# Patient Record
Sex: Female | Born: 1980
Health system: Southern US, Community
[De-identification: ages and names within clinical notes are randomized; demographics above are authoritative.]

## PROBLEM LIST (undated history)

## (undated) DIAGNOSIS — L309 Dermatitis, unspecified: Secondary | ICD-10-CM

## (undated) DIAGNOSIS — B009 Herpesviral infection, unspecified: Secondary | ICD-10-CM

## (undated) DIAGNOSIS — F419 Anxiety disorder, unspecified: Secondary | ICD-10-CM

## (undated) DIAGNOSIS — Z87442 Personal history of urinary calculi: Secondary | ICD-10-CM

---
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
## 2008-03-21 ENCOUNTER — Encounter: Admission: RE | Admit: 2008-03-21 | Discharge: 2008-03-21 | Payer: Self-pay | Admitting: Physician Assistant

## 2011-11-04 ENCOUNTER — Encounter: Payer: Self-pay | Admitting: Internal Medicine

## 2011-11-08 ENCOUNTER — Telehealth: Payer: Self-pay | Admitting: Gastroenterology

## 2011-11-08 NOTE — Telephone Encounter (Signed)
Left message for patient to call back  

## 2011-11-12 NOTE — Telephone Encounter (Signed)
I have been unable to reach pt messages have been left.  Pt has not returned any phone calls.  I will await further communication from pt

## 2011-11-14 ENCOUNTER — Encounter: Payer: Self-pay | Admitting: Internal Medicine

## 2011-11-22 ENCOUNTER — Ambulatory Visit: Payer: Self-pay | Admitting: Internal Medicine

## 2011-12-05 ENCOUNTER — Encounter: Payer: Self-pay | Admitting: Internal Medicine

## 2011-12-09 ENCOUNTER — Ambulatory Visit: Payer: Self-pay | Admitting: Internal Medicine

## 2014-06-15 ENCOUNTER — Ambulatory Visit: Payer: Self-pay | Admitting: Medical

## 2014-06-28 ENCOUNTER — Telehealth: Payer: Self-pay | Admitting: *Deleted

## 2014-06-28 NOTE — Telephone Encounter (Signed)
Unable to reach patient at time of Pre-Visit Call.  Left message for patient to return call when available.    

## 2014-06-29 ENCOUNTER — Encounter: Payer: Self-pay | Admitting: *Deleted

## 2014-06-29 ENCOUNTER — Ambulatory Visit (INDEPENDENT_AMBULATORY_CARE_PROVIDER_SITE_OTHER): Payer: 59 | Admitting: Medical

## 2014-06-29 ENCOUNTER — Encounter: Payer: Self-pay | Admitting: Medical

## 2014-06-29 VITALS — BP 117/78 | HR 96 | Temp 98.1°F | Ht 65.0 in | Wt 184.8 lb

## 2014-06-29 DIAGNOSIS — Z0000 Encounter for general adult medical examination without abnormal findings: Secondary | ICD-10-CM

## 2014-06-29 DIAGNOSIS — N200 Calculus of kidney: Secondary | ICD-10-CM

## 2014-06-29 DIAGNOSIS — Z8709 Personal history of other diseases of the respiratory system: Secondary | ICD-10-CM | POA: Insufficient documentation

## 2014-06-29 DIAGNOSIS — S46819A Strain of other muscles, fascia and tendons at shoulder and upper arm level, unspecified arm, initial encounter: Secondary | ICD-10-CM | POA: Insufficient documentation

## 2014-06-29 LAB — POCT URINALYSIS DIPSTICK
BILIRUBIN UA: NEGATIVE
Blood, UA: NEGATIVE
GLUCOSE UA: NEGATIVE
Ketones, UA: NEGATIVE
Leukocytes, UA: NEGATIVE
NITRITE UA: NEGATIVE
Protein, UA: NEGATIVE
UROBILINOGEN UA: 0.2
pH, UA: 7.5

## 2014-06-29 NOTE — Assessment & Plan Note (Signed)
When younger as child 20 yrs since any symptoms.

## 2014-06-29 NOTE — Progress Notes (Signed)
Pre visit review using our clinic review tool, if applicable. No additional management support is needed unless otherwise documented below in the visit note. 

## 2014-06-29 NOTE — Progress Notes (Signed)
Subjective:    Patient ID: Valerie Small, female    DOB: March 27, 1980, 34 y.o.   MRN: [id number]  HPI  I have reviewed pt PMH, PSH, FH, Social History and Surgical History  Asthma- When she younger. Exercise induced. No symptoms for 20 yrs or more.  Kidney stone- about 6 years ago. None since.  Pt works [employer], Exercises started 3 wks ago, weights and cardio, coffee 2 cups a day, diet improving recently, married-1 child.  Pt last pap 1 year ago. LMP- pt has mirena.  Pt has not had physical in some years and would like to have one.   On review only. Pt states occasional rt sided trapezius discomfort and points to her medial scapula area. Discomfort with faint tingling sensation(last 3-4 minutes) She does some range of motion  rt upper extremity  and seems to help. Noticed more at work. She notes more when at work typing or painting at home. Pt is rt handed. Symptom for 3 months on and off and not constant.     Review of Systems  Constitutional: Negative for fever, chills, diaphoresis, activity change and fatigue.  HENT: Negative.   Respiratory: Negative for cough, chest tightness and shortness of breath.   Cardiovascular: Negative for chest pain, palpitations and leg swelling.  Gastrointestinal: Negative for nausea, vomiting and abdominal pain.  Musculoskeletal: Negative for neck pain and neck stiffness.       Rt trapezius to medial scapula region faint discomfort/tingling at times.  Neurological: Negative for dizziness, tremors, seizures, syncope, facial asymmetry, speech difficulty, weakness, light-headedness, numbness and headaches.  Psychiatric/Behavioral: Negative for behavioral problems, confusion and agitation. The patient is not nervous/anxious.      Past Medical History  Diagnosis Date  . Asthma   . Chronic kidney disease     Some kidney stone when son was born.    History   Social History  . Marital Status: Married    Spouse Name: N/A    . Number of Children: N/A  . Years of Education: N/A   Occupational History  . Not on file.   Social History Main Topics  . Smoking status: Never Smoker   . Smokeless tobacco: Never Used  . Alcohol Use: 0.0 oz/week    0 Standard drinks or equivalent per week     Comment: 4 beers on weekend.  . Drug Use: No  . Sexual Activity: Not on file   Other Topics Concern  . Not on file   Social History Narrative    Past Surgical History  Procedure Laterality Date  . Wisdom tooth extraction      Family History  Problem Relation Age of Onset  . Hypertension Mother   . Hypertension Father   . Diabetes Maternal Aunt     No Known Allergies  No current outpatient prescriptions on file prior to visit.   No current facility-administered medications on file prior to visit.    BP 117/78 mmHg  Pulse 96  Temp(Src) 98.1 F (36.7 C) (Other (Comment))  Ht 5\' 5"  (1.651 m)  Wt 184 lb 12.8 oz (83.825 kg)  BMI 30.75 kg/m2  SpO2 96%      Objective:   Physical Exam  General Mental Status- Alert. General Appearance- Not in acute distress.   Skin General: Color- Normal Color. Moisture- Normal Moisture.  Neck Carotid Arteries- Normal color. Moisture- Normal Moisture. No carotid bruits. No JVD. No rt side trapezius pain presently.  Chest and Lung Exam Auscultation: Breath  Sounds:-Normal.  Cardiovascular Auscultation:Rythm- Regular. Murmurs & Other Heart Sounds:Auscultation of the heart reveals- No Murmurs.  Abdomen Inspection:-Inspeection Normal. Palpation/Percussion:Note:No mass. Palpation and Percussion of the abdomen reveal- Non Tender, Non Distended + BS, no rebound or guarding.  Back- no pain presently. Particularly over rt  medial scapula border Rt upper ext movments- no pain rt trap or scapula region.     Neurologic Cranial Nerve exam:- CN III-XII intact(No nystagmus), symmetric smile. Drift Test:- No drift. Romberg Exam:- Negative.  Heal to Toe Gait  exam:-Normal. Finger to Nose:- Normal/Intact Strength:- 5/5 equal and symmetric strength both upper and lower extremities.  Breast, gyn exam done by gyn(1 yr ago)She sees them regularly.      Assessment & Plan:

## 2014-06-29 NOTE — Telephone Encounter (Signed)
Pre-Visit Call completed with patient and chart updated.   Pre-Visit Info documented in Specialty Comments under [employer].    

## 2014-06-29 NOTE — Assessment & Plan Note (Addendum)
Symptoms mild and intermittent. Did not treat today. Only conservative measures discusses. Try alleve otc.   Noted for historical purposes today. Coded exam as wellness since this was only mentioned and not necessarily treatable condition on today visit.

## 2014-06-29 NOTE — Assessment & Plan Note (Signed)
One time and 6 yrs ago.

## 2014-06-29 NOTE — Assessment & Plan Note (Signed)
Will put in future order cbc, cmp, tsh, and lipid panel to be done fasting within a week.  Ua done today. Pt up to date on pap. Done 1 yr ago. tdap unclear if up today may have had 6 yrs ago. She will check with her gyn office. If not done in past 10 yrs then will update. She will notify us.

## 2014-06-29 NOTE — Addendum Note (Signed)
Addended by: Noreene LarssonLARSON, Amaira Safley A on: 06/29/2014 01:20 PM   Modules accepted: Medications

## 2014-06-29 NOTE — Patient Instructions (Addendum)
Wellness examination Will put in future order cbc, cmp, tsh, and lipid panel to be done fasting within a week.  Ua done today. Pt up to date on pap. Done 1 yr ago. tdap unclear if up today may have had 6 yrs ago. She will check with her gyn office. If not done in past 10 yrs then will update. She will notify us.    Preventive Care for Adults A healthy lifestyle and preventive care can promote health and wellness. Preventive health guidelines for women include the following key practices.  A routine yearly physical is a good way to check with your health care provider about your health and preventive screening. It is a chance to share any concerns and updates on your health and to receive a thorough exam.  Visit your dentist for a routine exam and preventive care every 6 months. Brush your teeth twice a day and floss once a day. Good oral hygiene prevents tooth decay and gum disease.  The frequency of eye exams is based on your age, health, family medical history, use of contact lenses, and other factors. Follow your health care provider's recommendations for frequency of eye exams.  Eat a healthy diet. Foods like vegetables, fruits, whole grains, low-fat dairy products, and lean protein foods contain the nutrients you need without too many calories. Decrease your intake of foods high in solid fats, added sugars, and salt. Eat the right amount of calories for you.Get information about a proper diet from your health care provider, if necessary.  Regular physical exercise is one of the most important things you can do for your health. Most adults should get at least 150 minutes of moderate-intensity exercise (any activity that increases your heart rate and causes you to sweat) each week. In addition, most adults need muscle-strengthening exercises on 2 or more days a week.  Maintain a healthy weight. The body mass index (BMI) is a screening tool to identify possible weight problems. It provides an  estimate of body fat based on height and weight. Your health care provider can find your BMI and can help you achieve or maintain a healthy weight.For adults 20 years and older:  A BMI below 18.5 is considered underweight.  A BMI of 18.5 to 24.9 is normal.  A BMI of 25 to 29.9 is considered overweight.  A BMI of 30 and above is considered obese.  Maintain normal blood lipids and cholesterol levels by exercising and minimizing your intake of saturated fat. Eat a balanced diet with plenty of fruit and vegetables. Blood tests for lipids and cholesterol should begin at age 52 and be repeated every 5 years. If your lipid or cholesterol levels are high, you are over 50, or you are at high risk for heart disease, you may need your cholesterol levels checked more frequently.Ongoing high lipid and cholesterol levels should be treated with medicines if diet and exercise are not working.  If you smoke, find out from your health care provider how to quit. If you do not use tobacco, do not start.  Lung cancer screening is recommended for adults aged 69-80 years who are at high risk for developing lung cancer because of a history of smoking. A yearly low-dose CT scan of the lungs is recommended for people who have at least a 30-pack-year history of smoking and are a current smoker or have quit within the past 15 years. A pack year of smoking is smoking an average of 1 pack of cigarettes a day  for 1 year (for example: 1 pack a day for 30 years or 2 packs a day for 15 years). Yearly screening should continue until the smoker has stopped smoking for at least 15 years. Yearly screening should be stopped for people who develop a health problem that would prevent them from having lung cancer treatment.  If you are pregnant, do not drink alcohol. If you are breastfeeding, be very cautious about drinking alcohol. If you are not pregnant and choose to drink alcohol, do not have more than 1 drink per day. One drink is  considered to be 12 ounces (355 mL) of beer, 5 ounces (148 mL) of wine, or 1.5 ounces (44 mL) of liquor.  Avoid use of street drugs. Do not share needles with anyone. Ask for help if you need support or instructions about stopping the use of drugs.  High blood pressure causes heart disease and increases the risk of stroke. Your blood pressure should be checked at least every 1 to 2 years. Ongoing high blood pressure should be treated with medicines if weight loss and exercise do not work.  If you are 26-58 years old, ask your health care provider if you should take aspirin to prevent strokes.  Diabetes screening involves taking a blood sample to check your fasting blood sugar level. This should be done once every 3 years, after age 58, if you are within normal weight and without risk factors for diabetes. Testing should be considered at a younger age or be carried out more frequently if you are overweight and have at least 1 risk factor for diabetes.  Breast cancer screening is essential preventive care for women. You should practice "breast self-awareness." This means understanding the normal appearance and feel of your breasts and may include breast self-examination. Any changes detected, no matter how small, should be reported to a health care provider. Women in their 59s and 30s should have a clinical breast exam (CBE) by a health care provider as part of a regular health exam every 1 to 3 years. After age 31, women should have a CBE every year. Starting at age 53, women should consider having a mammogram (breast X-ray test) every year. Women who have a family history of breast cancer should talk to their health care provider about genetic screening. Women at a high risk of breast cancer should talk to their health care providers about having an MRI and a mammogram every year.  Breast cancer gene (BRCA)-related cancer risk assessment is recommended for women who have family members with BRCA-related  cancers. BRCA-related cancers include breast, ovarian, tubal, and peritoneal cancers. Having family members with these cancers may be associated with an increased risk for harmful changes (mutations) in the breast cancer genes BRCA1 and BRCA2. Results of the assessment will determine the need for genetic counseling and BRCA1 and BRCA2 testing.  Routine pelvic exams to screen for cancer are no longer recommended for nonpregnant women who are considered low risk for cancer of the pelvic organs (ovaries, uterus, and vagina) and who do not have symptoms. Ask your health care provider if a screening pelvic exam is right for you.  If you have had past treatment for cervical cancer or a condition that could lead to cancer, you need Pap tests and screening for cancer for at least 20 years after your treatment. If Pap tests have been discontinued, your risk factors (such as having a new sexual partner) need to be reassessed to determine if screening should be resumed.  Some women have medical problems that increase the chance of getting cervical cancer. In these cases, your health care provider may recommend more frequent screening and Pap tests.  The HPV test is an additional test that may be used for cervical cancer screening. The HPV test looks for the virus that can cause the cell changes on the cervix. The cells collected during the Pap test can be tested for HPV. The HPV test could be used to screen women aged 32 years and older, and should be used in women of any age who have unclear Pap test results. After the age of 17, women should have HPV testing at the same frequency as a Pap test.  Colorectal cancer can be detected and often prevented. Most routine colorectal cancer screening begins at the age of 89 years and continues through age 25 years. However, your health care provider may recommend screening at an earlier age if you have risk factors for colon cancer. On a yearly basis, your health care provider  may provide home test kits to check for hidden blood in the stool. Use of a small camera at the end of a tube, to directly examine the colon (sigmoidoscopy or colonoscopy), can detect the earliest forms of colorectal cancer. Talk to your health care provider about this at age 27, when routine screening begins. Direct exam of the colon should be repeated every 5-10 years through age 60 years, unless early forms of pre-cancerous polyps or small growths are found.  People who are at an increased risk for hepatitis B should be screened for this virus. You are considered at high risk for hepatitis B if:  You were born in a country where hepatitis B occurs often. Talk with your health care provider about which countries are considered high risk.  Your parents were born in a high-risk country and you have not received a shot to protect against hepatitis B (hepatitis B vaccine).  You have HIV or AIDS.  You use needles to inject street drugs.  You live with, or have sex with, someone who has hepatitis B.  You get hemodialysis treatment.  You take certain medicines for conditions like cancer, organ transplantation, and autoimmune conditions.  Hepatitis C blood testing is recommended for all people born from [birth year] through 1965 and any individual with known risks for hepatitis C.  Practice safe sex. Use condoms and avoid high-risk sexual practices to reduce the spread of sexually transmitted infections (STIs). STIs include gonorrhea, chlamydia, syphilis, trichomonas, herpes, HPV, and human immunodeficiency virus (HIV). Herpes, HIV, and HPV are viral illnesses that have no cure. They can result in disability, cancer, and death.  You should be screened for sexually transmitted illnesses (STIs) including gonorrhea and chlamydia if:  You are sexually active and are younger than 24 years.  You are older than 24 years and your health care provider tells you that you are at risk for this type of  infection.  Your sexual activity has changed since you were last screened and you are at an increased risk for chlamydia or gonorrhea. Ask your health care provider if you are at risk.  If you are at risk of being infected with HIV, it is recommended that you take a prescription medicine daily to prevent HIV infection. This is called preexposure prophylaxis (PrEP). You are considered at risk if:  You are a heterosexual woman, are sexually active, and are at increased risk for HIV infection.  You take drugs by injection.  You are  sexually active with a partner who has HIV.  Talk with your health care provider about whether you are at high risk of being infected with HIV. If you choose to begin PrEP, you should first be tested for HIV. You should then be tested every 3 months for as long as you are taking PrEP.  Osteoporosis is a disease in which the bones lose minerals and strength with aging. This can result in serious bone fractures or breaks. The risk of osteoporosis can be identified using a bone density scan. Women ages 43 years and over and women at risk for fractures or osteoporosis should discuss screening with their health care providers. Ask your health care provider whether you should take a calcium supplement or vitamin D to reduce the rate of osteoporosis.  Menopause can be associated with physical symptoms and risks. Hormone replacement therapy is available to decrease symptoms and risks. You should talk to your health care provider about whether hormone replacement therapy is right for you.  Use sunscreen. Apply sunscreen liberally and repeatedly throughout the day. You should seek shade when your shadow is shorter than you. Protect yourself by wearing long sleeves, pants, a wide-brimmed hat, and sunglasses year round, whenever you are outdoors.  Once a month, do a whole body skin exam, using a mirror to look at the skin on your back. Tell your health care provider of new moles,  moles that have irregular borders, moles that are larger than a pencil eraser, or moles that have changed in shape or color.  Stay current with required vaccines (immunizations).  Influenza vaccine. All adults should be immunized every year.  Tetanus, diphtheria, and acellular pertussis (Td, Tdap) vaccine. Pregnant women should receive 1 dose of Tdap vaccine during each pregnancy. The dose should be obtained regardless of the length of time since the last dose. Immunization is preferred during the 27th-36th week of gestation. An adult who has not previously received Tdap or who does not know her vaccine status should receive 1 dose of Tdap. This initial dose should be followed by tetanus and diphtheria toxoids (Td) booster doses every 10 years. Adults with an unknown or incomplete history of completing a 3-dose immunization series with Td-containing vaccines should begin or complete a primary immunization series including a Tdap dose. Adults should receive a Td booster every 10 years.  Varicella vaccine. An adult without evidence of immunity to varicella should receive 2 doses or a second dose if she has previously received 1 dose. Pregnant females who do not have evidence of immunity should receive the first dose after pregnancy. This first dose should be obtained before leaving the health care facility. The second dose should be obtained 4-8 weeks after the first dose.  Human papillomavirus (HPV) vaccine. Females aged 13-26 years who have not received the vaccine previously should obtain the 3-dose series. The vaccine is not recommended for use in pregnant females. However, pregnancy testing is not needed before receiving a dose. If a female is found to be pregnant after receiving a dose, no treatment is needed. In that case, the remaining doses should be delayed until after the pregnancy. Immunization is recommended for any person with an immunocompromised condition through the age of 69 years if she  did not get any or all doses earlier. During the 3-dose series, the second dose should be obtained 4-8 weeks after the first dose. The third dose should be obtained 24 weeks after the first dose and 16 weeks after the second dose.  Zoster vaccine. One dose is recommended for adults aged 89 years or older unless certain conditions are present.  Measles, mumps, and rubella (MMR) vaccine. Adults born before [birth year] generally are considered immune to measles and mumps. Adults born in 1 or later should have 1 or more doses of MMR vaccine unless there is a contraindication to the vaccine or there is laboratory evidence of immunity to each of the three diseases. A routine second dose of MMR vaccine should be obtained at least 28 days after the first dose for students attending postsecondary schools, health care workers, or international travelers. People who received inactivated measles vaccine or an unknown type of measles vaccine during 1963-1967 should receive 2 doses of MMR vaccine. People who received inactivated mumps vaccine or an unknown type of mumps vaccine before 1979 and are at high risk for mumps infection should consider immunization with 2 doses of MMR vaccine. For females of childbearing age, rubella immunity should be determined. If there is no evidence of immunity, females who are not pregnant should be vaccinated. If there is no evidence of immunity, females who are pregnant should delay immunization until after pregnancy. Unvaccinated health care workers born before [birth year] who lack laboratory evidence of measles, mumps, or rubella immunity or laboratory confirmation of disease should consider measles and mumps immunization with 2 doses of MMR vaccine or rubella immunization with 1 dose of MMR vaccine.  Pneumococcal 13-valent conjugate (PCV13) vaccine. When indicated, a person who is uncertain of her immunization history and has no record of immunization should receive the PCV13 vaccine. An adult  aged 15 years or older who has certain medical conditions and has not been previously immunized should receive 1 dose of PCV13 vaccine. This PCV13 should be followed with a dose of pneumococcal polysaccharide (PPSV23) vaccine. The PPSV23 vaccine dose should be obtained at least 8 weeks after the dose of PCV13 vaccine. An adult aged 65 years or older who has certain medical conditions and previously received 1 or more doses of PPSV23 vaccine should receive 1 dose of PCV13. The PCV13 vaccine dose should be obtained 1 or more years after the last PPSV23 vaccine dose.  Pneumococcal polysaccharide (PPSV23) vaccine. When PCV13 is also indicated, PCV13 should be obtained first. All adults aged 38 years and older should be immunized. An adult younger than age 25 years who has certain medical conditions should be immunized. Any person who resides in a nursing home or long-term care facility should be immunized. An adult smoker should be immunized. People with an immunocompromised condition and certain other conditions should receive both PCV13 and PPSV23 vaccines. People with human immunodeficiency virus (HIV) infection should be immunized as soon as possible after diagnosis. Immunization during chemotherapy or radiation therapy should be avoided. Routine use of PPSV23 vaccine is not recommended for American Indians, [place] Natives, or people younger than 65 years unless there are medical conditions that require PPSV23 vaccine. When indicated, people who have unknown immunization and have no record of immunization should receive PPSV23 vaccine. One-time revaccination 5 years after the first dose of PPSV23 is recommended for people aged 19-64 years who have chronic kidney failure, nephrotic syndrome, asplenia, or immunocompromised conditions. People who received 1-2 doses of PPSV23 before age 35 years should receive another dose of PPSV23 vaccine at age 67 years or later if at least 5 years have passed since the previous  dose. Doses of PPSV23 are not needed for people immunized with PPSV23 at or after age 68 years.  Meningococcal  vaccine. Adults with asplenia or persistent complement component deficiencies should receive 2 doses of quadrivalent meningococcal conjugate (MenACWY-D) vaccine. The doses should be obtained at least 2 months apart. Microbiologists working with certain meningococcal bacteria, [employer] recruits, people at risk during an outbreak, and people who travel to or live in countries with a high rate of meningitis should be immunized. A first-year college student up through age 69 years who is living in a residence hall should receive a dose if she did not receive a dose on or after her 16th birthday. Adults who have certain high-risk conditions should receive one or more doses of vaccine.  Hepatitis A vaccine. Adults who wish to be protected from this disease, have certain high-risk conditions, work with hepatitis A-infected animals, work in hepatitis A research labs, or travel to or work in countries with a high rate of hepatitis A should be immunized. Adults who were previously unvaccinated and who anticipate close contact with an international adoptee during the first 60 days after arrival in the [place] from a country with a high rate of hepatitis A should be immunized.  Hepatitis B vaccine. Adults who wish to be protected from this disease, have certain high-risk conditions, may be exposed to blood or other infectious body fluids, are household contacts or sex partners of hepatitis B positive people, are clients or workers in certain care facilities, or travel to or work in countries with a high rate of hepatitis B should be immunized.  Haemophilus influenzae type b (Hib) vaccine. A previously unvaccinated person with asplenia or sickle cell disease or having a scheduled splenectomy should receive 1 dose of Hib vaccine. Regardless of previous immunization, a recipient of a hematopoietic stem cell  transplant should receive a 3-dose series 6-12 months after her successful transplant. Hib vaccine is not recommended for adults with HIV infection. Preventive Services / Frequency Ages 17 to 85 years  Blood pressure check.** / Every 1 to 2 years.  Lipid and cholesterol check.** / Every 5 years beginning at age 54.  Clinical breast exam.** / Every 3 years for women in their 48s and 72s.  BRCA-related cancer risk assessment.** / For women who have family members with a BRCA-related cancer (breast, ovarian, tubal, or peritoneal cancers).  Pap test.** / Every 2 years from ages 26 through 6. Every 3 years starting at age 43 through age 73 or 70 with a history of 3 consecutive normal Pap tests.  HPV screening.** / Every 3 years from ages 30 through ages 57 to 17 with a history of 3 consecutive normal Pap tests.  Hepatitis C blood test.** / For any individual with known risks for hepatitis C.  Skin self-exam. / Monthly.  Influenza vaccine. / Every year.  Tetanus, diphtheria, and acellular pertussis (Tdap, Td) vaccine.** / Consult your health care provider. Pregnant women should receive 1 dose of Tdap vaccine during each pregnancy. 1 dose of Td every 10 years.  Varicella vaccine.** / Consult your health care provider. Pregnant females who do not have evidence of immunity should receive the first dose after pregnancy.  HPV vaccine. / 3 doses over 6 months, if 88 and younger. The vaccine is not recommended for use in pregnant females. However, pregnancy testing is not needed before receiving a dose.  Measles, mumps, rubella (MMR) vaccine.** / You need at least 1 dose of MMR if you were born in 1957 or later. You may also need a 2nd dose. For females of childbearing age, rubella immunity should be  determined. If there is no evidence of immunity, females who are not pregnant should be vaccinated. If there is no evidence of immunity, females who are pregnant should delay immunization until after  pregnancy.  Pneumococcal 13-valent conjugate (PCV13) vaccine.** / Consult your health care provider.  Pneumococcal polysaccharide (PPSV23) vaccine.** / 1 to 2 doses if you smoke cigarettes or if you have certain conditions.  Meningococcal vaccine.** / 1 dose if you are age 11 to 64 years and a Market researcher living in a residence hall, or have one of several medical conditions, you need to get vaccinated against meningococcal disease. You may also need additional booster doses.  Hepatitis A vaccine.** / Consult your health care provider.  Hepatitis B vaccine.** / Consult your health care provider.  Haemophilus influenzae type b (Hib) vaccine.** / Consult your health care provider. Ages 23 to 44 years  Blood pressure check.** / Every 1 to 2 years.  Lipid and cholesterol check.** / Every 5 years beginning at age 65 years.  Lung cancer screening. / Every year if you are aged 55-80 years and have a 30-pack-year history of smoking and currently smoke or have quit within the past 15 years. Yearly screening is stopped once you have quit smoking for at least 15 years or develop a health problem that would prevent you from having lung cancer treatment.  Clinical breast exam.** / Every year after age 26 years.  BRCA-related cancer risk assessment.** / For women who have family members with a BRCA-related cancer (breast, ovarian, tubal, or peritoneal cancers).  Mammogram.** / Every year beginning at age 72 years and continuing for as long as you are in good health. Consult with your health care provider.  Pap test.** / Every 3 years starting at age 48 years through age 59 or 19 years with a history of 3 consecutive normal Pap tests.  HPV screening.** / Every 3 years from ages 53 years through ages 54 to 44 years with a history of 3 consecutive normal Pap tests.  Fecal occult blood test (FOBT) of stool. / Every year beginning at age 30 years and continuing until age 40 years. You may  not need to do this test if you get a colonoscopy every 10 years.  Flexible sigmoidoscopy or colonoscopy.** / Every 5 years for a flexible sigmoidoscopy or every 10 years for a colonoscopy beginning at age 45 years and continuing until age 61 years.  Hepatitis C blood test.** / For all people born from [birth year] through 1965 and any individual with known risks for hepatitis C.  Skin self-exam. / Monthly.  Influenza vaccine. / Every year.  Tetanus, diphtheria, and acellular pertussis (Tdap/Td) vaccine.** / Consult your health care provider. Pregnant women should receive 1 dose of Tdap vaccine during each pregnancy. 1 dose of Td every 10 years.  Varicella vaccine.** / Consult your health care provider. Pregnant females who do not have evidence of immunity should receive the first dose after pregnancy.  Zoster vaccine.** / 1 dose for adults aged 66 years or older.  Measles, mumps, rubella (MMR) vaccine.** / You need at least 1 dose of MMR if you were born in 1957 or later. You may also need a 2nd dose. For females of childbearing age, rubella immunity should be determined. If there is no evidence of immunity, females who are not pregnant should be vaccinated. If there is no evidence of immunity, females who are pregnant should delay immunization until after pregnancy.  Pneumococcal 13-valent conjugate (PCV13) vaccine.** /  Consult your health care provider.  Pneumococcal polysaccharide (PPSV23) vaccine.** / 1 to 2 doses if you smoke cigarettes or if you have certain conditions.  Meningococcal vaccine.** / Consult your health care provider.  Hepatitis A vaccine.** / Consult your health care provider.  Hepatitis B vaccine.** / Consult your health care provider.  Haemophilus influenzae type b (Hib) vaccine.** / Consult your health care provider. Ages 40 years and over  Blood pressure check.** / Every 1 to 2 years.  Lipid and cholesterol check.** / Every 5 years beginning at age 62 years.  Lung  cancer screening. / Every year if you are aged 77-80 years and have a 30-pack-year history of smoking and currently smoke or have quit within the past 15 years. Yearly screening is stopped once you have quit smoking for at least 15 years or develop a health problem that would prevent you from having lung cancer treatment.  Clinical breast exam.** / Every year after age 44 years.  BRCA-related cancer risk assessment.** / For women who have family members with a BRCA-related cancer (breast, ovarian, tubal, or peritoneal cancers).  Mammogram.** / Every year beginning at age 75 years and continuing for as long as you are in good health. Consult with your health care provider.  Pap test.** / Every 3 years starting at age 37 years through age 39 or 53 years with 3 consecutive normal Pap tests. Testing can be stopped between 65 and 70 years with 3 consecutive normal Pap tests and no abnormal Pap or HPV tests in the past 10 years.  HPV screening.** / Every 3 years from ages 45 years through ages 43 or 53 years with a history of 3 consecutive normal Pap tests. Testing can be stopped between 65 and 70 years with 3 consecutive normal Pap tests and no abnormal Pap or HPV tests in the past 10 years.  Fecal occult blood test (FOBT) of stool. / Every year beginning at age 66 years and continuing until age 75 years. You may not need to do this test if you get a colonoscopy every 10 years.  Flexible sigmoidoscopy or colonoscopy.** / Every 5 years for a flexible sigmoidoscopy or every 10 years for a colonoscopy beginning at age 61 years and continuing until age 2 years.  Hepatitis C blood test.** / For all people born from [birth year] through 1965 and any individual with known risks for hepatitis C.  Osteoporosis screening.** / A one-time screening for women ages 19 years and over and women at risk for fractures or osteoporosis.  Skin self-exam. / Monthly.  Influenza vaccine. / Every year.  Tetanus, diphtheria, and  acellular pertussis (Tdap/Td) vaccine.** / 1 dose of Td every 10 years.  Varicella vaccine.** / Consult your health care provider.  Zoster vaccine.** / 1 dose for adults aged 20 years or older.  Pneumococcal 13-valent conjugate (PCV13) vaccine.** / Consult your health care provider.  Pneumococcal polysaccharide (PPSV23) vaccine.** / 1 dose for all adults aged 17 years and older.  Meningococcal vaccine.** / Consult your health care provider.  Hepatitis A vaccine.** / Consult your health care provider.  Hepatitis B vaccine.** / Consult your health care provider.  Haemophilus influenzae type b (Hib) vaccine.** / Consult your health care provider. ** Family history and personal history of risk and conditions may change your health care provider's recommendations. Document Released: 04/23/2001 Document Revised: 07/12/2013 Document Reviewed: 07/23/2010 [facility] Patient Information 2015 [place], [place]. This information is not intended to replace advice given to you by your health  care provider. Make sure you discuss any questions you have with your health care provider.

## 2014-07-05 ENCOUNTER — Other Ambulatory Visit (INDEPENDENT_AMBULATORY_CARE_PROVIDER_SITE_OTHER): Payer: 59

## 2014-07-05 DIAGNOSIS — Z0000 Encounter for general adult medical examination without abnormal findings: Secondary | ICD-10-CM

## 2014-07-05 DIAGNOSIS — Z0189 Encounter for other specified special examinations: Secondary | ICD-10-CM

## 2014-07-05 LAB — COMPREHENSIVE METABOLIC PANEL
ALK PHOS: 53 U/L (ref 39–117)
ALT: 26 U/L (ref 0–35)
AST: 24 U/L (ref 0–37)
Albumin: 4 g/dL (ref 3.5–5.2)
BILIRUBIN TOTAL: 0.6 mg/dL (ref 0.2–1.2)
BUN: 14 mg/dL (ref 6–23)
CHLORIDE: 103 meq/L (ref 96–112)
CO2: 30 mEq/L (ref 19–32)
CREATININE: 0.74 mg/dL (ref 0.40–1.20)
Calcium: 9 mg/dL (ref 8.4–10.5)
GFR: 95.86 mL/min (ref >60.00)
Glucose, Bld: 74 mg/dL (ref 70–99)
Potassium: 3.6 mEq/L (ref 3.5–5.1)
Sodium: 137 mEq/L (ref 135–145)
TOTAL PROTEIN: 6.3 g/dL (ref 6.0–8.3)

## 2014-07-05 LAB — LIPID PANEL
Cholesterol: 152 mg/dL (ref 0–200)
HDL: 54.5 mg/dL (ref >39.00)
LDL CALC: 85 mg/dL (ref 0–99)
NONHDL: 97.5
TRIGLYCERIDES: 64 mg/dL (ref 0.0–149.0)
Total CHOL/HDL Ratio: 3
VLDL: 12.8 mg/dL (ref 0.0–40.0)

## 2014-07-05 LAB — CBC WITH DIFFERENTIAL/PLATELET
BASOS ABS: 0 10*3/uL (ref 0.0–0.1)
Basophils Relative: 0.5 % (ref 0.0–3.0)
EOS ABS: 0.2 10*3/uL (ref 0.0–0.7)
Eosinophils Relative: 2.7 % (ref 0.0–5.0)
HCT: 40.6 % (ref 36.0–46.0)
Hemoglobin: 13.9 g/dL (ref 12.0–15.0)
Lymphocytes Relative: 22.3 % (ref 12.0–46.0)
Lymphs Abs: 1.3 10*3/uL (ref 0.7–4.0)
MCHC: 34.2 g/dL (ref 30.0–36.0)
MCV: 85.6 fl (ref 78.0–100.0)
Monocytes Absolute: 0.4 10*3/uL (ref 0.1–1.0)
Monocytes Relative: 6.9 % (ref 3.0–12.0)
NEUTROS PCT: 67.6 % (ref 43.0–77.0)
Neutro Abs: 4 10*3/uL (ref 1.4–7.7)
PLATELETS: 264 10*3/uL (ref 150.0–400.0)
RBC: 4.74 Mil/uL (ref 3.87–5.11)
RDW: 13.8 % (ref 11.5–15.5)
WBC: 5.9 10*3/uL (ref 4.0–10.5)

## 2014-07-05 LAB — TSH: TSH: 1.62 u[IU]/mL (ref 0.35–4.50)

## 2014-11-28 ENCOUNTER — Ambulatory Visit (INDEPENDENT_AMBULATORY_CARE_PROVIDER_SITE_OTHER): Payer: 59 | Admitting: Medical

## 2014-11-28 ENCOUNTER — Encounter: Payer: Self-pay | Admitting: Medical

## 2014-11-28 VITALS — BP 103/50 | HR 71 | Temp 97.7°F | Resp 16 | Ht 65.0 in | Wt 193.2 lb

## 2014-11-28 DIAGNOSIS — H6690 Otitis media, unspecified, unspecified ear: Secondary | ICD-10-CM | POA: Diagnosis not present

## 2014-11-28 MED ORDER — AMOXICILLIN-POT CLAVULANATE 875-125 MG PO TABS: 1.0000 | ORAL_TABLET | Freq: Two times a day (BID) | ORAL | Status: DC

## 2014-11-28 NOTE — Progress Notes (Signed)
Subjective:    Patient ID: Valerie Small, female    DOB: 1980/06/01, 34 y.o.   MRN: [id number]  HPI  Pt in with rt ear pain. Pt go ear pierced 9 days. This past Saturday pain started to get worse. Pt states last night pain moderate. 3 am woke up with rt ear pain. Pt states some pain even though taking motrin 800 mg a day   Pt has mirena IUD.  Review of Systems  Constitutional: Negative for fever, chills and fatigue.  Respiratory: Negative for cough, choking and chest tightness.   Cardiovascular: Negative for chest pain and palpitations.  Neurological: Negative for dizziness, facial asymmetry and headaches.  Hematological: Negative for adenopathy. Does not bruise/bleed easily.  Psychiatric/Behavioral: Negative for behavioral problems and confusion.     Past Medical History  Diagnosis Date  . Asthma   . Chronic kidney disease     Some kidney stone when son was born.  . Trapezius strain 06/29/2014    Social History   Social History  . Marital Status: Married    Spouse Name: N/A  . Number of Children: N/A  . Years of Education: N/A   Occupational History  . Not on file.   Social History Main Topics  . Smoking status: Never Smoker   . Smokeless tobacco: Never Used  . Alcohol Use: 0.0 oz/week    0 Standard drinks or equivalent per week     Comment: 4 beers on weekend.  . Drug Use: No  . Sexual Activity: Not on file   Other Topics Concern  . Not on file   Social History Narrative    Past Surgical History  Procedure Laterality Date  . Wisdom tooth extraction      Family History  Problem Relation Age of Onset  . Hypertension Mother   . Hypertension Father   . Diabetes Maternal Aunt     No Known Allergies  Current Outpatient Prescriptions on File Prior to Visit  Medication Sig Dispense Refill  . valACYclovir (VALTREX) 500 MG tablet Take 500 mg by mouth as needed.     No current facility-administered medications on file prior to visit.    BP 103/50  mmHg  Pulse 71  Temp(Src) 97.7 F (36.5 C) (Oral)  Resp 16  Ht  (1.651 m)  Wt 193 lb 3.2 oz (87.635 kg)  BMI 32.15 kg/m2  SpO2 100%       Objective:   Physical Exam  General  Mental Status - Alert. General Appearance - Well groomed. Not in acute distress.  Skin Rashes- No Rashes.  HEENT Head- Normal. Ear Auditory Canal - Left- Normal. Right - Normal.Tympanic Membrane- Left- Normal. Right- Normal. Rt outer ear- redness and swelling of the anihelix. Piercing through the inferior crus region. Eye Sclera/Conjunctiva- Left- Normal. Right- Normal. Nose & Sinuses Nasal Mucosa- Left-  Not boggy or Congested. Right-  Not  boggy or Congested. Mouth & Throat Lips: Upper Lip- Normal: no dryness, cracking, pallor, cyanosis, or vesicular eruption. Lower Lip-Normal: no dryness, cracking, pallor, cyanosis or vesicular eruption. Buccal Mucosa- Bilateral- No Aphthous ulcers. Oropharynx- No Discharge or Erythema. Tonsils: Characteristics- Bilateral- No Erythema or Congestion. Size/Enlargement- Bilateral- No enlargement. Discharge- bilateral-None.  Neck Neck- Supple. No Masses.   Chest and Lung Exam Auscultation: Breath Sounds:- even and unlabored, but bilateral upper lobe rhonchi.  Cardiovascular Auscultation:Rythm- Regular, rate and rhythm. Murmurs & Other Heart Sounds:Ausculatation of the heart reveal- No Murmurs.  Lymphatic Head & Neck General Head & Neck Lymphatics:  Bilateral: Description- No Localized lymphadenopathy.      Assessment & Plan:  Infection of the outer ear. Recommend taking out the earring today. Start augmentin. Can do warm compresses twice daily. Follow up up Thursday or as needed. If area worsens or persists then would refer to ENT.  Recommend removing of piercing to avoid possibility of scarring long term to ear.

## 2014-11-28 NOTE — Patient Instructions (Addendum)
Infection of the outer ear. Recommend taking out the earring today. Start augmentin. Can do warm compresses twice daily. Follow up up Thursday or as needed. If area worsens or persists then would refer to ENT.  Recommend removing of piercing to avoid possibility of scarring long term to ear.  Follow up this Thursday or as needed

## 2014-11-28 NOTE — Progress Notes (Signed)
Pre visit review using our clinic review tool, if applicable. No additional management support is needed unless otherwise documented below in the visit note. 

## 2014-12-01 ENCOUNTER — Encounter: Payer: Self-pay | Admitting: Medical

## 2014-12-01 ENCOUNTER — Ambulatory Visit (INDEPENDENT_AMBULATORY_CARE_PROVIDER_SITE_OTHER): Payer: 59 | Admitting: Medical

## 2014-12-01 VITALS — BP 98/60 | HR 70 | Temp 97.9°F | Ht 66.0 in | Wt 190.0 lb

## 2014-12-01 DIAGNOSIS — H6690 Otitis media, unspecified, unspecified ear: Secondary | ICD-10-CM

## 2014-12-01 NOTE — Patient Instructions (Addendum)
Would recommend continuing the antibiotic until you  complete full course. Follow up after antibiotics or sooner if complications.  My advisement on removing of earring still stands.(let us know if any point complications)  Follow up in 7 days or as needed

## 2014-12-01 NOTE — Progress Notes (Signed)
Pre visit review using our clinic review tool, if applicable. No additional management support is needed unless otherwise documented below in the visit note. 
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
## 2015-05-23 ENCOUNTER — Telehealth: Payer: Self-pay | Admitting: Medical

## 2015-05-23 NOTE — Telephone Encounter (Signed)
Pt decined flu shot

## 2015-05-24 NOTE — Telephone Encounter (Signed)
Noted  

## 2016-10-23 ENCOUNTER — Ambulatory Visit (INDEPENDENT_AMBULATORY_CARE_PROVIDER_SITE_OTHER): Payer: 59 | Admitting: Medical

## 2016-10-23 ENCOUNTER — Telehealth: Payer: Self-pay | Admitting: Medical

## 2016-10-23 ENCOUNTER — Encounter: Payer: Self-pay | Admitting: Medical

## 2016-10-23 VITALS — BP 134/82 | HR 75 | Temp 98.1°F | Ht 65.0 in | Wt 180.8 lb

## 2016-10-23 DIAGNOSIS — R51 Headache: Secondary | ICD-10-CM

## 2016-10-23 DIAGNOSIS — R202 Paresthesia of skin: Secondary | ICD-10-CM | POA: Diagnosis not present

## 2016-10-23 DIAGNOSIS — R252 Cramp and spasm: Secondary | ICD-10-CM

## 2016-10-23 DIAGNOSIS — R519 Headache, unspecified: Secondary | ICD-10-CM

## 2016-10-23 LAB — COMPREHENSIVE METABOLIC PANEL
ALBUMIN: 4.6 g/dL (ref 3.5–5.2)
ALT: 18 U/L (ref 0–35)
AST: 21 U/L (ref 0–37)
Alkaline Phosphatase: 65 U/L (ref 39–117)
BILIRUBIN TOTAL: 0.5 mg/dL (ref 0.2–1.2)
BUN: 16 mg/dL (ref 6–23)
CALCIUM: 9.4 mg/dL (ref 8.4–10.5)
CO2: 30 meq/L (ref 19–32)
CREATININE: 0.75 mg/dL (ref 0.40–1.20)
Chloride: 102 mEq/L (ref 96–112)
GFR: 93.12 mL/min (ref >60.00)
Glucose, Bld: 83 mg/dL (ref 70–99)
Potassium: 4.5 mEq/L (ref 3.5–5.1)
Sodium: 137 mEq/L (ref 135–145)
Total Protein: 6.8 g/dL (ref 6.0–8.3)

## 2016-10-23 LAB — SEDIMENTATION RATE: SED RATE: 3 mm/h (ref 0–20)

## 2016-10-23 LAB — MAGNESIUM: Magnesium: 1.9 mg/dL (ref 1.5–2.5)

## 2016-10-23 MED ORDER — DICLOFENAC SODIUM 75 MG PO TBEC: 75.0000 mg | DELAYED_RELEASE_TABLET | Freq: Two times a day (BID) | ORAL | 0 refills | Status: DC

## 2016-10-23 MED ORDER — CYCLOBENZAPRINE HCL 5 MG PO TABS: ORAL_TABLET | ORAL | 1 refills | Status: DC

## 2016-10-23 NOTE — Patient Instructions (Addendum)
For your recent ha's will rx diclofenac and flexeril to use as discussed.  By exam and symptoms I don't think ct imaging indicated but if any new symptoms let me know. Any severe symptoms then ED evaluation.  Your eye eye twitch symptoms need to be followed and will get cmp and mag level. I don't think this represent optic neuritis.  Please give us update tomorrow how you feel. If any tingle sensation persisting to your face.  Work note excuse.  Follow up in 10 days or as needed

## 2016-10-23 NOTE — Progress Notes (Signed)
Subjective:    Patient ID: Valerie CheekLesley Lamaster, female    DOB: [birth year], 36 y.o.   MRN: [id number]  HPI  Pt in with some recent ha today. She has hx of ha in past.   Pt in past had stress ha and work up for possible tension ha.   Pt state 9-10 years since work up HA clinic but ruled out severe dx. Mri was negative.  Pt states ha pattern last year 3-4 days a week. Occurs at end day will get ha. Some neck pain. No light or sound sensitive ha. No naueau or vomiting. Pt states ha will get respond to ibuprofen 800 mg relatively quick.  Pt has some tingling to face on both sides today. But on review no gross motor or sensory function deficits.  Pt states some eye twitching to left eye for 4 months. No direct eye pain. No light flashes or any floaters. Pt has friend in her 1830's dx with MS.   Level 2/10 pain ha presently at best.   Facial flush- pt thoguht her face felt flushed today. But not swollen,  Pt handling to face this morning   Mirena-use.    Review of Systems  Constitutional: Negative for chills, fatigue and fever.  HENT: Negative for congestion, drooling, ear discharge, facial swelling, hearing loss, sinus pressure and sneezing.   Eyes: Negative for pain, discharge, redness and visual disturbance.       Left eye lid twitch on and off for 4 moths.  Respiratory: Negative for cough, chest tightness, shortness of breath and wheezing.   Cardiovascular: Negative for chest pain and palpitations.  Genitourinary: Negative for difficulty urinating, dyspareunia, frequency and genital sores.  Musculoskeletal: Negative for back pain, myalgias, neck pain and neck stiffness.  Skin: Negative for rash.  Neurological: Positive for headaches. Negative for dizziness, seizures, speech difficulty, weakness, light-headedness and numbness.       Bilateral face tinngling.  Hematological: Negative for adenopathy. Does not bruise/bleed easily.  Psychiatric/Behavioral: Negative for behavioral  problems, confusion, dysphoric mood, sleep disturbance and suicidal ideas. The patient is not nervous/anxious.        Job hunting but denies major stress.    Past Medical History:  Diagnosis Date  . Asthma   . Chronic kidney disease    Some kidney stone when son was born.  . Trapezius strain 06/29/2014     Social History   Social History  . Marital status: Married    Spouse name: N/A  . Number of children: N/A  . Years of education: N/A   Occupational History  . Not on file.   Social History Main Topics  . Smoking status: Never Smoker  . Smokeless tobacco: Never Used  . Alcohol use 0.0 oz/week     Comment: 4 beers on weekend.  . Drug use: No  . Sexual activity: Not on file   Other Topics Concern  . Not on file   Social History Narrative  . No narrative on file    Past Surgical History:  Procedure Laterality Date  . WISDOM TOOTH EXTRACTION      Family History  Problem Relation Age of Onset  . Hypertension Mother   . Hypertension Father   . Diabetes Maternal Aunt     No Known Allergies  Current Outpatient Prescriptions on File Prior to Visit  Medication Sig Dispense Refill  . valACYclovir (VALTREX) 500 MG tablet Take 500 mg by mouth as needed.     No current facility-administered medications on  file prior to visit.     BP 134/82 (BP Location: Left Arm, Patient Position: Sitting, Cuff Size: Normal)   Pulse 75   Temp 98.1 F (36.7 C) (Oral)   Ht 5\' 5"  (1.651 m)   Wt 180 lb 12.8 oz (82 kg)   SpO2 98%   BMI 30.09 kg/m        Objective:   Physical Exam   General Mental Status- Alert. General Appearance- Not in acute distress.   Skin General: Color- Normal Color. Moisture- Normal Moisture. No dilated veins in temporal areas.  Neck Carotid Arteries- Normal color. Moisture- Normal Moisture. No carotid bruits. No JVD.  Chest and Lung Exam Auscultation: Breath Sounds:-Normal.  Cardiovascular Auscultation:Rythm- Regular. Murmurs & Other  Heart Sounds:Auscultation of the heart reveals- No Murmurs.  Abdomen Inspection:-Inspeection Normal. Palpation/Percussion:Note:No mass. Palpation and Percussion of the abdomen reveal- Non Tender, Non Distended + BS, no rebound or guarding.    Neurologic Cranial Nerve exam:- CN III-XII intact(No nystagmus), symmetric smile. Drift Test:- No drift. Romberg Exam:- Negative.  Heal to Toe Gait exam:-Normal. Finger to Nose:- Normal/Intact Strength:- 5/5 equal and symmetric strength both upper and lower extremities. Sharp and dull discrimination intact.   HEENT Head- Normal. Ear Auditory Canal - Left- Normal. Right - Normal.Tympanic Membrane- Left- Normal. Right- Normal. Eye Sclera/Conjunctiva- Left- Normal. Right- Normal. Nose & Sinuses Nasal Mucosa- Left-   Not Boggy and Congested. Right-  not  Boggy and  Congested.Bilateral  No maxillary and no  frontal sinus pressure. Mouth & Throat Lips: Upper Lip- Normal: no dryness, cracking, pallor, cyanosis, or vesicular eruption. Lower Lip-Normal: no dryness, cracking, pallor, cyanosis or vesicular eruption. Buccal Mucosa- Bilateral- No Aphthous ulcers. Oropharynx- No Discharge or Erythema. Tonsils: Characteristics- Bilateral- No Erythema or Congestion. Size/Enlargement- Bilateral- No enlargement. Discharge- bilateral-None.          Assessment & Plan:  For your recent ha's will rx diclofenac and flexeril to use as discussed.  By exam and symptoms I don't think ct imaging indicated but if any new symptoms let me know. Any severe symptoms then ED evaluation.  Your eye eye twitch symptoms need to be followed and will get cmp and mag level. I don't think this represent optic neuritis.  Please give [place] update tomorrow how you feel. If any tingle sensation persisting to your face.  Work note excuse.  Follow up in 10 days or as needed  Orit Sanville, Ramon Dredge, [employer]

## 2016-10-23 NOTE — Telephone Encounter (Signed)
[facility] - Client TELEPHONE ADVICE RECORD [facility]  Patient Name: Valerie Small  DOB: 1980/05/01    Initial Comment caller has numbness and tingling in her face . It just started today . Had an eye twitch for a few months    Nurse Assessment  Nurse: Odis LusterBowers, RN, Bjorn Loserhonda Date/Time (Eastern Time): 10/23/2016 10:04:03 AM  Confirm and document reason for call. If symptomatic, describe symptoms. ---caller has numbness and tingling in her face . It just started today . Had an eye twitch for a few months. Face felt swollen for the last few days, but doesnt' appear to be.  Does the patient have any new or worsening symptoms? ---Yes  Will a triage be completed? ---Yes  Related visit to physician within the last 2 weeks? ---No  Does the PT have any chronic conditions? (i.e. diabetes, asthma, etc.) ---No  Is the patient pregnant or possibly pregnant? (Ask all females between the ages of 7712-55) ---No  Is this a behavioral health or substance abuse call? ---No     Guidelines    Guideline Title Affirmed Question Affirmed Notes  Neurologic Deficit Headache (and neurologic deficit)    Final Disposition User   Go to ED Now Odis LusterBowers, RN, Bjorn Loserhonda    Comments  Caller refused to go to the ED but asked for appt with MD in office. Made appt for 11:30 with Kristine GarbeSaguire, Edward this morning at the [facility] office. Caller voiced understanding.   Referrals  GO TO FACILITY REFUSED   Disagree/Comply: Disagree  Disagree/Comply Reason: Disagree with instructions

## 2016-10-24 ENCOUNTER — Telehealth: Payer: Self-pay | Admitting: Medical

## 2016-10-24 ENCOUNTER — Encounter: Payer: Self-pay | Admitting: Medical

## 2016-10-24 DIAGNOSIS — R519 Headache, unspecified: Secondary | ICD-10-CM

## 2016-10-24 DIAGNOSIS — R51 Headache: Principal | ICD-10-CM

## 2016-10-24 NOTE — Telephone Encounter (Signed)
Try to schedule ct of head stat tomorrow please. See order.

## 2016-10-25 ENCOUNTER — Ambulatory Visit (HOSPITAL_BASED_OUTPATIENT_CLINIC_OR_DEPARTMENT_OTHER)
Admission: RE | Admit: 2016-10-25 | Discharge: 2016-10-25 | Disposition: A | Discharge status: Home / Self Care | Payer: 59 | Source: Ambulatory Visit | Attending: Medical | Admitting: Medical

## 2016-10-25 DIAGNOSIS — R51 Headache: Secondary | ICD-10-CM | POA: Insufficient documentation

## 2016-10-25 DIAGNOSIS — R519 Headache, unspecified: Secondary | ICD-10-CM

## 2016-10-25 NOTE — Telephone Encounter (Signed)
Awaiting call back from patient to schedule, insurance auth # [id number]

## 2016-10-29 ENCOUNTER — Ambulatory Visit (INDEPENDENT_AMBULATORY_CARE_PROVIDER_SITE_OTHER): Payer: 59 | Admitting: Medical

## 2016-10-29 ENCOUNTER — Encounter: Payer: Self-pay | Admitting: Medical

## 2016-10-29 VITALS — BP 123/67 | HR 73 | Temp 98.3°F | Resp 16 | Ht 65.0 in | Wt 185.6 lb

## 2016-10-29 DIAGNOSIS — R519 Headache, unspecified: Secondary | ICD-10-CM

## 2016-10-29 DIAGNOSIS — R51 Headache: Secondary | ICD-10-CM

## 2016-10-29 MED ORDER — BUTALBITAL-APAP-CAFFEINE 50-325-40 MG PO TABS: 1.0000 | ORAL_TABLET | Freq: Four times a day (QID) | ORAL | 0 refills | Status: DC | PRN

## 2016-10-29 NOTE — Progress Notes (Signed)
Subjective:    Patient ID: Valerie Small, female    DOB: 1980/04/02, 36 y.o.   MRN: [id number]  HPI  Pt in for follow up.  Pt states she still has ha but not severe. Some numbness and subjective tingling left side of face and her eye is still twitching. Ct of head was negative and sed rate. Pt had normal neurologic exam and sharp/dull discrimination was intact left side of her face.  Pt electrolytes to evaluate eye region twitching was normal.   Also since last visit pt has random drop of water sensation to top of her head.   Pt had mri more than 9 years ago.  Pt not sure where ago.  Pt states diclofenac did not help ha.  Flexeril did help her sleep.  LMP- mirena.  Pt states and on ha or years. Low level. 3/10 Thought related to work. Working with [employer]. No urinary incontinence. No extremity weakness.  Pt had ha  level at most 5/10 when is worst. On average about 3/10 level ha.   Review of Systems  Constitutional: Negative for chills and fatigue.  Respiratory: Negative for chest tightness, shortness of breath and wheezing.   Cardiovascular: Negative for chest pain and palpitations.  Gastrointestinal: Negative for abdominal pain.  Genitourinary: Negative for decreased urine volume, difficulty urinating, dysuria, flank pain and hematuria.  Musculoskeletal: Negative for back pain, joint swelling and neck stiffness.  Skin: Negative for rash.  Neurological: Positive for headaches. Negative for dizziness, speech difficulty and weakness.       See hpi.  Hematological: Negative for adenopathy. Does not bruise/bleed easily.  Psychiatric/Behavioral: Negative for behavioral problems, decreased concentration, dysphoric mood and suicidal ideas. The patient is not nervous/anxious.     Past Medical History:  Diagnosis Date  . Asthma   . Chronic kidney disease    Some kidney stone when son was born.  . Trapezius strain 06/29/2014     Social History   Social History  .  Marital status: Married    Spouse name: N/A  . Number of children: N/A  . Years of education: N/A   Occupational History  . Not on file.   Social History Main Topics  . Smoking status: Never Smoker  . Smokeless tobacco: Never Used  . Alcohol use 0.0 oz/week     Comment: 4 beers on weekend.  . Drug use: No  . Sexual activity: Not on file   Other Topics Concern  . Not on file   Social History Narrative  . No narrative on file    Past Surgical History:  Procedure Laterality Date  . WISDOM TOOTH EXTRACTION      Family History  Problem Relation Age of Onset  . Hypertension Mother   . Hypertension Father   . Diabetes Maternal Aunt     No Known Allergies  Current Outpatient Prescriptions on File Prior to Visit  Medication Sig Dispense Refill  . cyclobenzaprine (FLEXERIL) 5 MG tablet 1 tab po q hs prn ha. 10 tablet 1  . diclofenac (VOLTAREN) 75 MG EC tablet Take 1 tablet (75 mg total) by mouth 2 (two) times daily. 20 tablet 0  . levonorgestrel (MIRENA, 52 MG,) 20 MCG/24HR IUD Mirena 20 mcg/24 hr (5 years) intrauterine device  Take by intrauterine route.    . valACYclovir (VALTREX) 500 MG tablet Take 500 mg by mouth as needed.     No current facility-administered medications on file prior to visit.     BP 123/67  Pulse 73   Temp 98.3 F (36.8 C) (Oral)   Resp 16   Ht 5\' 5"  (1.651 m)   Wt 185 lb 9.6 oz (84.2 kg)   SpO2 100%   BMI 30.89 kg/m       Objective:   Physical Exam  General Mental Status- Alert. General Appearance- Not in acute distress.   Skin General: Color- Normal Color. Moisture- Normal Moisture.  Neck Carotid Arteries- Normal color. Moisture- Normal Moisture. No carotid bruits. No JVD.  Chest and Lung Exam Auscultation: Breath Sounds:-Normal.  Cardiovascular Auscultation:Rythm- Regular. Murmurs & Other Heart Sounds:Auscultation of the heart reveals- No Murmurs.  Abdomen Inspection:-Inspeection Normal. Palpation/Percussion:Note:No  mass. Palpation and Percussion of the abdomen reveal- Non Tender, Non Distended + BS, no rebound or guarding.   Neurologic Cranial Nerve exam:- CN III-XII intact(No nystagmus), symmetric smile. Drift Test:- No drift. Finger to Nose:- Normal/Intact Strength:- 5/5 equal and symmetric strength both upper and lower extremities.      Assessment & Plan:  For your history of headaches will try Fioricet since poor response to diclofenac. Stop diclofenac. You can still use flexeril if you have tender tight trapezius muscles but not to use both Fioricet and flexeril together.  Will refer to neuruologist. They might order mri  If severe ha with neurologic signs and symptoms as discussed then ED evaluation.  Follow up in 4-5 weeks or as needed. Hopefully will get you in with neurologist within 2-3 weeks.

## 2016-10-29 NOTE — Patient Instructions (Addendum)
For your history of headaches will try Fioricet since poor response to diclofenac. Stop diclofenac. You can still use flexeril if you have tender tight trapezius muscles but not to use both Fioricet and flexeril together.  Will refer to neuruologist. They might order mri  If severe ha with neurologic signs and symptoms as discussed then ED evaluation.  Follow up in 4-5 weeks or as needed. Hopefully will get you in with neurologist within 2-3 weeks.

## 2016-10-30 ENCOUNTER — Encounter: Payer: Self-pay | Admitting: Neurology

## 2016-11-01 ENCOUNTER — Ambulatory Visit: Payer: 59 | Admitting: Medical

## 2016-11-07 ENCOUNTER — Ambulatory Visit (INDEPENDENT_AMBULATORY_CARE_PROVIDER_SITE_OTHER): Payer: 59 | Admitting: Neurology

## 2016-11-07 ENCOUNTER — Encounter: Payer: Self-pay | Admitting: Neurology

## 2016-11-07 VITALS — BP 116/70 | HR 74 | Ht 65.0 in | Wt 186.5 lb

## 2016-11-07 DIAGNOSIS — G245 Blepharospasm: Secondary | ICD-10-CM

## 2016-11-07 DIAGNOSIS — R200 Anesthesia of skin: Secondary | ICD-10-CM | POA: Diagnosis not present

## 2016-11-07 DIAGNOSIS — G44229 Chronic tension-type headache, not intractable: Secondary | ICD-10-CM | POA: Diagnosis not present

## 2016-11-07 NOTE — Patient Instructions (Signed)
1.  We will check MRI of brain with and without contrast 2.  For headache, take ibuprofen but no more than 2 days out of the week (which you already take less than that) to prevent rebound headache.  Stop Fioricet.   3.  Proper sleep hygiene and stress management

## 2016-11-07 NOTE — Progress Notes (Signed)
NEUROLOGY CONSULTATION NOTE  Valerie Small MRN: [id number] DOB: April 20, 1980  Referring provider: Esperanza Richters, PA-C Primary care provider: Esperanza Richters, PA-C  Reason for consult:  Facial paresthesia, headache, eye twitching  HISTORY OF PRESENT ILLNESS: Valerie Small is a 36 year old right-handed female who presents for headache, left eye twitching and left sided facial paresthesia.  History supplemented by PCP note.  About 5 months ago, she began experiencing twitching in the left eye.  Then about 3 weeks ago, she developed paresthesias and hot sensation of the left side of her face, from the eye down to the jaw and sometimes into the neck.  There is no associated numbness or pain.  There is no associated flushing or facial weakness.  She reported some aural fullness.  It occurs daily but intermittent, lasting 4 to 5 hours every 30 minutes.  She denies unilateral numbness of the extremities but on three occasions over the past 3 weeks, she reports difficulty gripping a pen and had dropped some objects.  She denies neck pain.  She also has longstanding history of chronic headache for over 10 years.  It is bilateral, either frontal, top of head or occipital.  It is a 2/3 non-throbbing pressure lasting 15-20 minutes and occurring 3 to 4 times daily.  There is no associated nausea, vomiting, photophobia, phonophobia or visual disturbance.  There are no triggers or relieving factors.  About every 2 weeks, headaches are 5-6/10, in which she takes an ibuprofen.  She saw a headache specialist over 10 years ago.  MRI was reportedly normal.  She was diagnosed with tension headaches.  She was recently prescribed Fioricet and cyclobenzaprine, which she takes at night.  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  She does report stress, anxiety and poor sleep.  She drinks 1/2 cup of coffee daily.  She usually exercises 6 days a week but not as frequently since June.  She tries  to drink a gallon of water daily.    She denies family history of any neurologic disease.  CT of head from 10/25/16 was personally reviewed and was normal. 10/23/16 Labs:  CMP with Na 137, K 4.5, Cl 102, CO2 30, glucose 83, BUN 16, Cr 0.75, total bili 0.5, ALP 65, AST 21, ALT 18; Mg 1.9; Sed Rate 3  PAST MEDICAL HISTORY: Past Medical History:  Diagnosis Date  . Asthma   . Chronic kidney disease    Some kidney stone when son was born.  . Trapezius strain 06/29/2014    PAST SURGICAL HISTORY: Past Surgical History:  Procedure Laterality Date  . WISDOM TOOTH EXTRACTION      MEDICATIONS: Current Outpatient Prescriptions on File Prior to Visit  Medication Sig Dispense Refill  . butalbital-acetaminophen-caffeine (FIORICET, ESGIC) 50-325-40 MG tablet Take 1-2 tablets by mouth every 6 (six) hours as needed for headache. 12 tablet 0  . cyclobenzaprine (FLEXERIL) 5 MG tablet 1 tab po q hs prn ha. 10 tablet 1  . levonorgestrel (MIRENA, 52 MG,) 20 MCG/24HR IUD Mirena 20 mcg/24 hr (5 years) intrauterine device  Take by intrauterine route.    . valACYclovir (VALTREX) 500 MG tablet Take 500 mg by mouth as needed.     No current facility-administered medications on file prior to visit.     ALLERGIES: No Known Allergies  FAMILY HISTORY: Family History  Problem Relation Age of Onset  . Hypertension Mother   . Hypertension Father   . Diabetes Maternal Aunt  SOCIAL HISTORY: Social History   Social History  . Marital status: Married    Spouse name: N/A  . Number of children: 1  . Years of education: associates   Occupational History  . Customer service    Social History Main Topics  . Smoking status: Never Smoker  . Smokeless tobacco: Never Used  . Alcohol use 0.0 oz/week     Comment: 4 beers on weekend.  . Drug use: No  . Sexual activity: Not on file   Other Topics Concern  . Not on file   Social History Narrative   Married, lives with husband in one story home   1  child, 2 dogs    REVIEW OF SYSTEMS: Constitutional: No fevers, chills, or sweats, no generalized fatigue, change in appetite Eyes: No visual changes, double vision, eye pain Ear, nose and throat: No hearing loss, ear pain, nasal congestion, sore throat Cardiovascular: No chest pain, palpitations Respiratory:  No shortness of breath at rest or with exertion, wheezes GastrointestinaI: No nausea, vomiting, diarrhea, abdominal pain, fecal incontinence Genitourinary:  No dysuria, urinary retention or frequency Musculoskeletal:  No neck pain, back pain Integumentary: No rash, pruritus, skin lesions Neurological: as above Psychiatric: Some stress.  No depression, insomnia Endocrine: No palpitations, fatigue, diaphoresis, mood swings, change in appetite, change in weight, increased thirst Hematologic/Lymphatic:  No purpura, petechiae. Allergic/Immunologic: no itchy/runny eyes, nasal congestion, recent allergic reactions, rashes  PHYSICAL EXAM: Vitals:   11/07/16 0900  BP: 116/70  Pulse: 74  SpO2: 98%   General: No acute distress.  Patient appears well-groomed.  Head:  Normocephalic/atraumatic Eyes:  fundi examined but not visualized Neck: supple, no paraspinal tenderness, full range of motion Back: No paraspinal tenderness Heart: regular rate and rhythm Lungs: Clear to auscultation bilaterally. Vascular: No carotid bruits. Neurological Exam: Mental status: alert and oriented to person, place, and time, recent and remote memory intact, fund of knowledge intact, attention and concentration intact, speech fluent and not dysarthric, language intact. Cranial nerves: CN I: not tested CN II: pupils equal, round and reactive to light, visual fields intact CN III, IV, VI:  full range of motion, no nystagmus, no ptosis CN V: facial sensation intact CN VII: upper and lower face symmetric CN VIII: hearing intact CN IX, X: gag intact, uvula midline CN XI: sternocleidomastoid and trapezius  muscles intact CN XII: tongue midline Bulk & Tone: normal, no fasciculations. Motor:  5/5 throughout  Sensation: temperature and vibration sensation intact. Deep Tendon Reflexes:  2+ throughout, toes downgoing.  Finger to nose testing:  Without dysmetria.  Heel to shin:  Without dysmetria.   Gait:  Normal station and stride.  Able to turn and tandem walk. Romberg negative.  IMPRESSION: 1.  Left facial paresthesia.  Unclear etiology.  Any cause would be from the brain.  It does not seem to be from the cervical region.  Neurologic exam is normal. 2.  Eye twitching, likely related to stress and lack of sleep 3.  Chronic tension-type headache  PLAN: 1.  Check MRI of brain with and without contrast to evaluate for any intracranial abnormality that may be contributing to the left facial paresthesia and intermittent motor deficits of her right hand.  She will follow up if there are any findings warranting further evaluation.  If unremarkable, then I have no explanation for her symptoms.  2.  Recommend optimizing stress reduction and sleep hygiene 3.  If she wishes to pursue further treatment of headaches, she may follow up.  She  does not wish to do so at this time.  I did recommend not to take Fioricet due to high risk for rebound headache.  Thank you for allowing me to take part in the care of this patient.  Shon MilletAdam Makinzee Durley, DO  CC:  Esperanza RichtersEdward Saguier, PA-C

## 2016-11-21 ENCOUNTER — Ambulatory Visit
Admission: RE | Admit: 2016-11-21 | Discharge: 2016-11-21 | Disposition: A | Discharge status: Home / Self Care | Payer: 59 | Source: Ambulatory Visit | Attending: Neurology | Admitting: Neurology

## 2016-11-21 ENCOUNTER — Encounter: Payer: Self-pay | Admitting: Neurology

## 2016-11-21 DIAGNOSIS — G44229 Chronic tension-type headache, not intractable: Secondary | ICD-10-CM

## 2016-11-21 DIAGNOSIS — G245 Blepharospasm: Secondary | ICD-10-CM

## 2016-11-21 DIAGNOSIS — R200 Anesthesia of skin: Secondary | ICD-10-CM

## 2016-11-21 MED ORDER — GADOBENATE DIMEGLUMINE 529 MG/ML IV SOLN
18.0000 mL | Freq: Once | INTRAVENOUS | Status: AC | PRN
  Administered 2016-11-21: 18 mL via INTRAVENOUS

## 2016-11-22 ENCOUNTER — Telehealth: Payer: Self-pay

## 2016-11-22 ENCOUNTER — Encounter: Payer: Self-pay | Admitting: Medical

## 2016-11-22 NOTE — Telephone Encounter (Signed)
-----   Message from Drema Dallas, DO sent at 11/21/2016 12:27 PM EDT ----- MRI of brain looks okay.  It does show two very tiny spots on the brain which is nonspecific and does not seem to be abnormal or concerning.

## 2016-11-22 NOTE — Telephone Encounter (Signed)
Called and spoke w/Pt, advsd her of MRI results

## 2016-12-24 ENCOUNTER — Encounter (HOSPITAL_BASED_OUTPATIENT_CLINIC_OR_DEPARTMENT_OTHER): Payer: Self-pay | Admitting: *Deleted

## 2016-12-25 ENCOUNTER — Encounter (HOSPITAL_BASED_OUTPATIENT_CLINIC_OR_DEPARTMENT_OTHER): Payer: Self-pay | Admitting: *Deleted

## 2016-12-25 NOTE — Progress Notes (Signed)
NPO AFTER MN.  ARRIVE AT 0730.  NEEDS URINE PREG. AND T&S.  GETTING CBC DONE PRIOR TO DOS.

## 2016-12-30 DIAGNOSIS — Z79899 Other long term (current) drug therapy: Secondary | ICD-10-CM | POA: Diagnosis not present

## 2016-12-30 DIAGNOSIS — F419 Anxiety disorder, unspecified: Secondary | ICD-10-CM | POA: Diagnosis not present

## 2016-12-30 DIAGNOSIS — Z833 Family history of diabetes mellitus: Secondary | ICD-10-CM | POA: Diagnosis not present

## 2016-12-30 DIAGNOSIS — Z8249 Family history of ischemic heart disease and other diseases of the circulatory system: Secondary | ICD-10-CM | POA: Diagnosis not present

## 2016-12-30 DIAGNOSIS — Z302 Encounter for sterilization: Secondary | ICD-10-CM | POA: Diagnosis present

## 2016-12-30 LAB — TYPE AND SCREEN
ABO/RH(D): A POS
Antibody Screen: NEGATIVE

## 2016-12-30 LAB — CBC
HCT: 41 % (ref 36.0–46.0)
Hemoglobin: 14.2 g/dL (ref 12.0–15.0)
MCH: 29.9 pg (ref 26.0–34.0)
MCHC: 34.6 g/dL (ref 30.0–36.0)
MCV: 86.3 fL (ref 78.0–100.0)
Platelets: 301 10*3/uL (ref 150–400)
RBC: 4.75 MIL/uL (ref 3.87–5.11)
RDW: 13.2 % (ref 11.5–15.5)
WBC: 8.4 10*3/uL (ref 4.0–10.5)

## 2016-12-30 LAB — ABO/RH: ABO/RH(D): A POS

## 2017-01-01 NOTE — H&P (Signed)
Valerie Small is an [age] y.o. female G2P1011 here for scheduled bilateral tubal ligation. Pt had an IUD for past 5 years and had removed - does not desire another. R/B/A reviewed and pt reiterates desire for permanent sterilization  Pertinent Gynecological History: Menses: with minimal cramping Bleeding: regular Contraception: none DES exposure: denies Blood transfusions: none Sexually transmitted diseases: no past history Previous GYN Procedures: n/a  Last pap: normal  OB History: G2`, P2011  Menstrual History: Menarche age: [age] No LMP recorded. Patient is not currently having periods (Reason: Other).    Past Medical History:  Diagnosis Date  . Anxiety   . Eczema   . History of kidney stones    2009  passed spontaneously   . HSV-2 infection    genital    Past Surgical History:  Procedure Laterality Date  . WISDOM TOOTH EXTRACTION  2001    Family History  Problem Relation Age of Onset  . Hypertension Mother   . Hypertension Father   . Diabetes Maternal Aunt     Social History:  reports that she has never smoked. She has never used smokeless tobacco. She reports that she drinks alcohol. She reports that she does not use drugs.  Allergies: No Known Allergies  No prescriptions prior to admission.    ROS  Height 5\' 6"  (1.676 m), weight 185 lb (83.9 kg). Physical Exam  No results found for this or any previous visit (from the past 24 hour(s)).  No results found.  Assessment/Plan:   Valerie Small 01/01/2017, 6:40 PM  35yo with desire for permanent sterilization due to complete family status R/B/A reviewed Consent confirmed All questions answered Pt to OR when ready for bilateral tubal ligation

## 2017-01-02 ENCOUNTER — Ambulatory Visit (HOSPITAL_BASED_OUTPATIENT_CLINIC_OR_DEPARTMENT_OTHER): Payer: 59 | Admitting: Anesthesiology

## 2017-01-02 ENCOUNTER — Encounter (HOSPITAL_BASED_OUTPATIENT_CLINIC_OR_DEPARTMENT_OTHER)
Admission: RE | Disposition: A | Discharge status: Home / Self Care | Payer: Self-pay | Source: Ambulatory Visit | Attending: Obstetrics and Gynecology

## 2017-01-02 ENCOUNTER — Ambulatory Visit (HOSPITAL_BASED_OUTPATIENT_CLINIC_OR_DEPARTMENT_OTHER)
Admission: RE | Admit: 2017-01-02 | Discharge: 2017-01-02 | Disposition: A | Discharge status: Home / Self Care | Payer: 59 | Source: Ambulatory Visit | Attending: Obstetrics and Gynecology | Admitting: Obstetrics and Gynecology

## 2017-01-02 ENCOUNTER — Encounter (HOSPITAL_BASED_OUTPATIENT_CLINIC_OR_DEPARTMENT_OTHER): Payer: Self-pay | Admitting: Anesthesiology

## 2017-01-02 DIAGNOSIS — Z302 Encounter for sterilization: Secondary | ICD-10-CM | POA: Insufficient documentation

## 2017-01-02 DIAGNOSIS — Z79899 Other long term (current) drug therapy: Secondary | ICD-10-CM | POA: Insufficient documentation

## 2017-01-02 DIAGNOSIS — Z8249 Family history of ischemic heart disease and other diseases of the circulatory system: Secondary | ICD-10-CM | POA: Insufficient documentation

## 2017-01-02 DIAGNOSIS — Z9851 Tubal ligation status: Secondary | ICD-10-CM

## 2017-01-02 DIAGNOSIS — F419 Anxiety disorder, unspecified: Secondary | ICD-10-CM | POA: Insufficient documentation

## 2017-01-02 DIAGNOSIS — Z833 Family history of diabetes mellitus: Secondary | ICD-10-CM | POA: Insufficient documentation

## 2017-01-02 HISTORY — DX: Anxiety disorder, unspecified: F41.9

## 2017-01-02 HISTORY — PX: LAPAROSCOPIC TUBAL LIGATION: SHX1937

## 2017-01-02 HISTORY — DX: Herpesviral infection, unspecified: B00.9

## 2017-01-02 HISTORY — DX: Dermatitis, unspecified: L30.9

## 2017-01-02 HISTORY — DX: Personal history of urinary calculi: Z87.442

## 2017-01-02 LAB — POCT PREGNANCY, URINE: PREG TEST UR: NEGATIVE

## 2017-01-02 SURGERY — LIGATION, FALLOPIAN TUBE, LAPAROSCOPIC
Anesthesia: General | Site: Abdomen | Laterality: Bilateral

## 2017-01-02 MED ORDER — MIDAZOLAM HCL 2 MG/2ML IJ SOLN
INTRAMUSCULAR | Status: AC
  Filled 2017-01-02: qty 2

## 2017-01-02 MED ORDER — ROCURONIUM BROMIDE 50 MG/5ML IV SOSY
PREFILLED_SYRINGE | INTRAVENOUS | Status: DC | PRN
  Administered 2017-01-02: 30 mg via INTRAVENOUS
  Administered 2017-01-02: 10 mg via INTRAVENOUS

## 2017-01-02 MED ORDER — KETOROLAC TROMETHAMINE 30 MG/ML IJ SOLN
30.0000 mg | Freq: Once | INTRAMUSCULAR | Status: DC
  Filled 2017-01-02: qty 1

## 2017-01-02 MED ORDER — ONDANSETRON HCL 4 MG/2ML IJ SOLN
INTRAMUSCULAR | Status: DC | PRN
  Administered 2017-01-02: 4 mg via INTRAVENOUS

## 2017-01-02 MED ORDER — LACTATED RINGERS IV SOLN
INTRAVENOUS | Status: DC
  Administered 2017-01-02 (×2): via INTRAVENOUS
  Filled 2017-01-02: qty 1000

## 2017-01-02 MED ORDER — MIDAZOLAM HCL 5 MG/5ML IJ SOLN
INTRAMUSCULAR | Status: DC | PRN
  Administered 2017-01-02: 2 mg via INTRAVENOUS

## 2017-01-02 MED ORDER — MEPERIDINE HCL 25 MG/ML IJ SOLN
6.2500 mg | INTRAMUSCULAR | Status: DC | PRN
  Filled 2017-01-02: qty 1

## 2017-01-02 MED ORDER — FENTANYL CITRATE (PF) 100 MCG/2ML IJ SOLN
INTRAMUSCULAR | Status: AC
  Filled 2017-01-02: qty 2

## 2017-01-02 MED ORDER — FENTANYL CITRATE (PF) 100 MCG/2ML IJ SOLN
INTRAMUSCULAR | Status: DC | PRN
  Administered 2017-01-02 (×2): 50 ug via INTRAVENOUS

## 2017-01-02 MED ORDER — MENTHOL 3 MG MT LOZG
1.0000 | LOZENGE | OROMUCOSAL | Status: DC | PRN
  Filled 2017-01-02: qty 9

## 2017-01-02 MED ORDER — KETOROLAC TROMETHAMINE 30 MG/ML IJ SOLN
INTRAMUSCULAR | Status: AC
  Filled 2017-01-02: qty 1

## 2017-01-02 MED ORDER — DEXAMETHASONE SODIUM PHOSPHATE 10 MG/ML IJ SOLN
INTRAMUSCULAR | Status: AC
  Filled 2017-01-02: qty 1

## 2017-01-02 MED ORDER — LACTATED RINGERS IV SOLN
INTRAVENOUS | Status: DC
  Filled 2017-01-02: qty 1000

## 2017-01-02 MED ORDER — ONDANSETRON HCL 4 MG/2ML IJ SOLN
4.0000 mg | Freq: Four times a day (QID) | INTRAMUSCULAR | Status: DC | PRN
  Filled 2017-01-02: qty 2

## 2017-01-02 MED ORDER — OXYCODONE-ACETAMINOPHEN 5-325 MG PO TABS
1.0000 | ORAL_TABLET | ORAL | Status: DC | PRN
  Filled 2017-01-02: qty 2

## 2017-01-02 MED ORDER — HYDROMORPHONE HCL 1 MG/ML IJ SOLN
INTRAMUSCULAR | Status: AC
  Filled 2017-01-02: qty 1

## 2017-01-02 MED ORDER — OXYCODONE HCL 5 MG/5ML PO SOLN
5.0000 mg | Freq: Once | ORAL | Status: DC | PRN
  Filled 2017-01-02: qty 5

## 2017-01-02 MED ORDER — SIMETHICONE 80 MG PO CHEW
80.0000 mg | CHEWABLE_TABLET | Freq: Four times a day (QID) | ORAL | Status: DC | PRN
  Filled 2017-01-02: qty 1

## 2017-01-02 MED ORDER — OXYCODONE HCL 5 MG PO TABS
5.0000 mg | ORAL_TABLET | Freq: Once | ORAL | Status: DC | PRN
  Filled 2017-01-02: qty 1

## 2017-01-02 MED ORDER — BUPIVACAINE HCL (PF) 0.25 % IJ SOLN
INTRAMUSCULAR | Status: DC | PRN
  Administered 2017-01-02: 15 mL

## 2017-01-02 MED ORDER — PROPOFOL 10 MG/ML IV BOLUS
INTRAVENOUS | Status: DC | PRN
  Administered 2017-01-02: 170 mg via INTRAVENOUS
  Administered 2017-01-02: 30 mg via INTRAVENOUS

## 2017-01-02 MED ORDER — PROMETHAZINE HCL 25 MG/ML IJ SOLN
6.2500 mg | INTRAMUSCULAR | Status: DC | PRN
  Filled 2017-01-02: qty 1

## 2017-01-02 MED ORDER — SUGAMMADEX SODIUM 200 MG/2ML IV SOLN
INTRAVENOUS | Status: AC
  Filled 2017-01-02: qty 2

## 2017-01-02 MED ORDER — SUGAMMADEX SODIUM 200 MG/2ML IV SOLN
INTRAVENOUS | Status: DC | PRN
  Administered 2017-01-02: 175 mg via INTRAVENOUS

## 2017-01-02 MED ORDER — ONDANSETRON HCL 4 MG/2ML IJ SOLN
INTRAMUSCULAR | Status: AC
  Filled 2017-01-02: qty 2

## 2017-01-02 MED ORDER — IBUPROFEN 600 MG PO TABS: 600.0000 mg | ORAL_TABLET | Freq: Four times a day (QID) | ORAL | 1 refills | Status: DC | PRN

## 2017-01-02 MED ORDER — IBUPROFEN 600 MG PO TABS
600.0000 mg | ORAL_TABLET | Freq: Four times a day (QID) | ORAL | Status: DC | PRN
  Filled 2017-01-02: qty 1

## 2017-01-02 MED ORDER — KETOROLAC TROMETHAMINE 30 MG/ML IJ SOLN
INTRAMUSCULAR | Status: DC | PRN
  Administered 2017-01-02: 30 mg via INTRAVENOUS

## 2017-01-02 MED ORDER — LIDOCAINE 2% (20 MG/ML) 5 ML SYRINGE
INTRAMUSCULAR | Status: AC
  Filled 2017-01-02: qty 5

## 2017-01-02 MED ORDER — OXYCODONE-ACETAMINOPHEN 5-325 MG PO TABS: 1.0000 | ORAL_TABLET | ORAL | 0 refills | Status: DC | PRN

## 2017-01-02 MED ORDER — DEXAMETHASONE SODIUM PHOSPHATE 10 MG/ML IJ SOLN
INTRAMUSCULAR | Status: DC | PRN
  Administered 2017-01-02: 10 mg via INTRAVENOUS

## 2017-01-02 MED ORDER — PROPOFOL 10 MG/ML IV BOLUS
INTRAVENOUS | Status: AC
  Filled 2017-01-02: qty 40

## 2017-01-02 MED ORDER — ROCURONIUM BROMIDE 50 MG/5ML IV SOSY
PREFILLED_SYRINGE | INTRAVENOUS | Status: AC
  Filled 2017-01-02: qty 5

## 2017-01-02 MED ORDER — ONDANSETRON HCL 4 MG PO TABS
4.0000 mg | ORAL_TABLET | Freq: Four times a day (QID) | ORAL | Status: DC | PRN
  Filled 2017-01-02: qty 1

## 2017-01-02 MED ORDER — LIDOCAINE 2% (20 MG/ML) 5 ML SYRINGE
INTRAMUSCULAR | Status: DC | PRN
  Administered 2017-01-02: 100 mg via INTRAVENOUS

## 2017-01-02 MED ORDER — HYDROMORPHONE HCL 1 MG/ML IJ SOLN
0.2500 mg | INTRAMUSCULAR | Status: DC | PRN
  Administered 2017-01-02: 0.5 mg via INTRAVENOUS
  Filled 2017-01-02: qty 0.5

## 2017-01-02 MED FILL — OXYCOD/ACETAMINOPHEN 5-325M: 5-325 | 4 days supply | Qty: 20 | Fill #0

## 2017-01-02 SURGICAL SUPPLY — 17 items
CLOTH BEACON ORANGE TIMEOUT ST (SAFETY) ×2 IMPLANT
DERMABOND ADVANCED (GAUZE/BANDAGES/DRESSINGS) ×1
DERMABOND ADVANCED .7 DNX12 (GAUZE/BANDAGES/DRESSINGS) ×1 IMPLANT
DRSG COVADERM PLUS 2X2 (GAUZE/BANDAGES/DRESSINGS) ×4 IMPLANT
DURAPREP 26ML APPLICATOR (WOUND CARE) ×2 IMPLANT
GLOVE BIO SURGEON STRL SZ 6.5 (GLOVE) ×2 IMPLANT
GLOVE BIOGEL PI IND STRL 7.0 (GLOVE) ×3 IMPLANT
GLOVE BIOGEL PI INDICATOR 7.0 (GLOVE) ×3
GOWN STRL REUS W/TWL LRG LVL3 (GOWN DISPOSABLE) ×6 IMPLANT
PACK LAPAROSCOPY BASIN (CUSTOM PROCEDURE TRAY) ×2 IMPLANT
PACK TRENDGUARD 450 HYBRID PRO (MISCELLANEOUS) ×1 IMPLANT
SUT VICRYL 0 UR6 27IN ABS (SUTURE) ×2 IMPLANT
SUT VICRYL 4-0 PS2 18IN ABS (SUTURE) ×2 IMPLANT
TOWEL OR 17X24 6PK STRL BLUE (TOWEL DISPOSABLE) ×4 IMPLANT
TRENDGUARD 450 HYBRID PRO PACK (MISCELLANEOUS) ×2
TROCAR XCEL OPT SLVE 5M 100M (ENDOMECHANICALS) ×4 IMPLANT
WARMER LAPAROSCOPE (MISCELLANEOUS) ×2 IMPLANT

## 2017-01-02 NOTE — Transfer of Care (Signed)
Immediate Anesthesia Transfer of Care Note  Patient: Valerie Small  Procedure(s) Performed: LAPAROSCOPIC TUBAL LIGATION (Bilateral Abdomen)  Patient Location: PACU  Anesthesia Type:General  Level of Consciousness: awake, alert  and oriented  Airway & Oxygen Therapy: Patient Spontanous Breathing and Patient connected to nasal cannula oxygen  Post-op Assessment: Report given to RN  Post vital signs: Reviewed and stable  Last Vitals: 120/71 Vitals:   01/02/17 0751 01/02/17 1034  BP: 115/71   Pulse: 79 69  Resp: 16 (!) 24  Temp: 36.5 C (!) 36.4 C  SpO2: 100% 100%    Last Pain:  Vitals:   01/02/17 1034  TempSrc:   PainSc: 4       Patients Stated Pain Goal: 3 (01/02/17 0820)  Complications: No apparent anesthesia complications

## 2017-01-02 NOTE — Interval H&P Note (Signed)
History and Physical Interval Note:  01/02/2017 9:29 AM  Valerie Small  has presented today for surgery, with the diagnosis of Sterilization  The various methods of treatment have been discussed with the patient and family. After consideration of risks, benefits and other options for treatment, the patient has consented to  Procedure(s) with comments: LAPAROSCOPIC TUBAL LIGATION (Bilateral) - Request RNFA as a surgical intervention .  The patient's history has been reviewed, patient examined, no change in status, stable for surgery.  I have reviewed the patient's chart and labs.  Questions were answered to the patient's satisfaction.     [name]

## 2017-01-02 NOTE — Anesthesia Postprocedure Evaluation (Signed)
Anesthesia Post Note  Patient: Valerie Small  Procedure(s) Performed: LAPAROSCOPIC TUBAL LIGATION (Bilateral Abdomen)     Patient location during evaluation: PACU Anesthesia Type: General Level of consciousness: awake and alert Pain management: pain level controlled Vital Signs Assessment: post-procedure vital signs reviewed and stable Respiratory status: spontaneous breathing, nonlabored ventilation and respiratory function stable Cardiovascular status: blood pressure returned to baseline and stable Postop Assessment: no apparent nausea or vomiting Anesthetic complications: no    Last Vitals:  Vitals:   01/02/17 1130 01/02/17 1210  BP: 119/70 (!) 111/59  Pulse: 67 70  Resp: 15 17  Temp:  36.6 C  SpO2: 100% 100%    Last Pain:  Vitals:   01/02/17 1210  TempSrc: Oral  PainSc:                  Lowella CurbWarren Ray Aasha Dina

## 2017-01-02 NOTE — Anesthesia Preprocedure Evaluation (Signed)
Anesthesia Evaluation  Patient identified by MRN, date of birth, ID band Patient awake    Reviewed: Allergy & Precautions, NPO status , Patient's Chart, lab work & pertinent test results  Airway Mallampati: II  TM Distance: >3 FB Neck ROM: Full    Dental no notable dental hx.    Pulmonary neg pulmonary ROS,    Pulmonary exam normal breath sounds clear to auscultation       Cardiovascular negative cardio ROS Normal cardiovascular exam Rhythm:Regular Rate:Normal     Neuro/Psych Anxiety negative neurological ROS  negative psych ROS   GI/Hepatic negative GI ROS, Neg liver ROS,   Endo/Other  negative endocrine ROS  Renal/GU negative Renal ROS  negative genitourinary   Musculoskeletal negative musculoskeletal ROS (+)   Abdominal (+) + obese,   Peds negative pediatric ROS (+)  Hematology negative hematology ROS (+)   Anesthesia Other Findings   Reproductive/Obstetrics negative OB ROS                             Anesthesia Physical Anesthesia Plan  ASA: II  Anesthesia Plan: General   Post-op Pain Management:    Induction: Intravenous  PONV Risk Score and Plan: 3 and Ondansetron, Midazolam and Dexamethasone  Airway Management Planned: Oral ETT  Additional Equipment:   Intra-op Plan:   Post-operative Plan: Extubation in OR  Informed Consent: I have reviewed the patients History and Physical, chart, labs and discussed the procedure including the risks, benefits and alternatives for the proposed anesthesia with the patient or authorized representative who has indicated his/her understanding and acceptance.   Dental advisory given  Plan Discussed with: CRNA  Anesthesia Plan Comments:         Anesthesia Quick Evaluation

## 2017-01-02 NOTE — Discharge Instructions (Signed)
Nothing in vagina for 2 weeks.  No sex, tampons, and douching.  Other instructions as in [facility].   Post Anesthesia Home Care Instructions  Activity: Get plenty of rest for the remainder of the day. A responsible individual must stay with you for 24 hours following the procedure.  For the next 24 hours, DO NOT: -Drive a car -Advertising copywriterperate machinery -Drink alcoholic beverages -Take any medication unless instructed by your physician -Make any legal decisions or sign important papers.  Meals: Start with liquid foods such as gelatin or soup. Progress to regular foods as tolerated. Avoid greasy, spicy, heavy foods. If nausea and/or vomiting occur, drink only clear liquids until the nausea and/or vomiting subsides. Call your physician if vomiting continues.  Special Instructions/Symptoms: Your throat may feel dry or sore from the anesthesia or the breathing tube placed in your throat during surgery. If this causes discomfort, gargle with warm salt water. The discomfort should disappear within 24 hours.  If you had a scopolamine patch placed behind your ear for the management of post- operative nausea and/or vomiting:  1. The medication in the patch is effective for 72 hours, after which it should be removed.  Wrap patch in a tissue and discard in the trash. Wash hands thoroughly with soap and water. 2. You may remove the patch earlier than 72 hours if you experience unpleasant side effects which may include dry mouth, dizziness or visual disturbances. 3. Avoid touching the patch. Wash your hands with soap and water after contact with the patch.   HOME CARE INSTRUCTIONS - LAPAROSCOPY  Wound Care: The bandaids or dressing which are placed over the skin openings may be removed the day after surgery. The incision should be kept clean and dry. The stitches do not need to be removed. Should the incision become sore, red, and swollen after the first week, check with your  doctor.  Personal Hygiene: Shower the day after your procedure. Always wipe from front to back after elimination.   Activity: Do not drive or operate any equipment today. The effects of the anesthesia are still present and drowsiness may result. Rest today, not necessarily flat bed rest, just take it easy. You may resume your normal activity in one to three days or as instructed by your physician.  Sexual Activity: You resume sexual activity as indicated by your physician_________. If your laparoscopy was for a sterilization ( tubes tied ), continue current method of birth control until after your next period or ask for specific instructions from your doctor.  Diet: Eat a light diet as desired this evening. You may resume a regular diet tomorrow.  Return to Work: Two to three days or as indicated by your doctor.  Expectations After Surgery: Your surgery will cause vaginal drainage or spotting which may continue for 2-3 days. Mild abdominal discomfort or tenderness is not unusual and some shoulder pain may also be noted which can be relieved by lying flat in pain.  Call Your Doctor If these Occur:  Persistent or heavy bleeding at incision site       Redness or swelling around incision       Elevation of temperature greater than 100 degrees F

## 2017-01-02 NOTE — Op Note (Signed)
Operative Note    Preoperative Diagnosis: Requests permanent sterilization/complete family status   Postoperative Diagnosis: Same   Procedure: Laparoscopic bilateral tubal fulgaration   Surgeon: Mindi SlickerBanga, C DO  Anesthesia: General  Fluids: 1L EBL: <625ml UOP: voided just prior to OR IVF: LR  Findings: Grossly normal uterus, tubes and ovaries   Specimen: n/a   Procedure Note Patient was taken to the operating room where general anesthesia was obtained without difficulty. She was then prepped and draped in the normal sterile fashion in the dorsal lithotomy position. An appropriate timeout was performed. A speculum was then placed within the vagina and a Hulka tenaculum placed within the cervix for uterine manipulation. Attention was then turned to the patient's abdomen after draping where the infraumbilicus was incised and a 5mm scope placed under direct visualization. Gas flow was then applied and a pneumoperitoneum obtained with approximate 3 L of CO2 gas. With patient in Trendelenburg the uterus and tubes and ovaries were inspected with nl findings. A second 5mm scope was placed under direct visualization the suprapubic midline region. Both fallopian tubes were easily identified and followed to their fimbriated ends. There were mild filmy adhesions at the fimbriated end of the left fallopian tubes.  A gyrus cautery was then introduced through the operative scope and the left fallopian tube grasped approximately 3 cm from the uterine cornua and cauterized sequentially for 2-3cm with good blanching noted. Attention was then turned to the right fallopian tube which was likewise grasped proximally 3 cm from the uterine cornua and similarly cauterized. The remainder of the pelvis and abdomen were inspected and found to be normal. The patient was then flattened and all instruments were removed from the abdomen and the pneumoperitoneum reduced through the operative trocar. The trocar was finally  removed and the infraumbilical incision closed with 4-0 Vicryl and dermabond. The suprapubic incision was also closed with 4-0 vicryl and a bandage. The hulka tenaculum was removed- hemostasis noted. Patient was then awakened and taken to the recovery room in good condition. Counts were correct

## 2017-01-02 NOTE — Anesthesia Procedure Notes (Signed)
Procedure Name: Intubation Date/Time: 01/02/2017 9:34 AM Performed by: Bethena Roys T Pre-anesthesia Checklist: Patient identified, Emergency Drugs available, Suction available and Patient being monitored Patient Re-evaluated:Patient Re-evaluated prior to induction Oxygen Delivery Method: Circle system utilized Preoxygenation: Pre-oxygenation with 100% oxygen Induction Type: IV induction Ventilation: Mask ventilation without difficulty Laryngoscope Size: Mac and 3 Grade View: Grade I Tube type: Oral Tube size: 7.0 mm Number of attempts: 1 Airway Equipment and Method: Stylet and Oral airway Placement Confirmation: ETT inserted through vocal cords under direct vision,  positive ETCO2 and breath sounds checked- equal and bilateral Secured at: 21 cm Tube secured with: Tape Dental Injury: Teeth and Oropharynx as per pre-operative assessment

## 2017-01-03 ENCOUNTER — Encounter (HOSPITAL_BASED_OUTPATIENT_CLINIC_OR_DEPARTMENT_OTHER): Payer: Self-pay | Admitting: Obstetrics and Gynecology

## 2017-06-10 DIAGNOSIS — H00012 Hordeolum externum right lower eyelid: Secondary | ICD-10-CM | POA: Diagnosis not present

## 2017-12-08 ENCOUNTER — Ambulatory Visit: Payer: 59 | Admitting: Medical

## 2017-12-08 DIAGNOSIS — Z0289 Encounter for other administrative examinations: Secondary | ICD-10-CM

## 2018-01-05 DIAGNOSIS — Z130 Encounter for screening for diseases of the blood and blood-forming organs and certain disorders involving the immune mechanism: Secondary | ICD-10-CM | POA: Diagnosis not present

## 2018-01-05 DIAGNOSIS — Z01419 Encounter for gynecological examination (general) (routine) without abnormal findings: Secondary | ICD-10-CM | POA: Diagnosis not present

## 2018-01-23 ENCOUNTER — Encounter: Payer: Self-pay | Admitting: Family Medicine

## 2018-01-23 ENCOUNTER — Ambulatory Visit: Payer: 59 | Admitting: Family Medicine

## 2018-01-23 VITALS — BP 108/78 | HR 60 | Temp 97.8°F | Ht 65.0 in | Wt 192.1 lb

## 2018-01-23 DIAGNOSIS — H6502 Acute serous otitis media, left ear: Secondary | ICD-10-CM

## 2018-01-23 DIAGNOSIS — J069 Acute upper respiratory infection, unspecified: Secondary | ICD-10-CM

## 2018-01-23 MED ORDER — AMOXICILLIN 875 MG PO TABS: 875.0000 mg | ORAL_TABLET | Freq: Two times a day (BID) | ORAL | 0 refills | Status: AC

## 2018-01-23 MED ORDER — METHYLPREDNISOLONE ACETATE 80 MG/ML IJ SUSP
80.0000 mg | Freq: Once | INTRAMUSCULAR | Status: AC
  Administered 2018-01-23: 80 mg via INTRAMUSCULAR

## 2018-01-23 NOTE — Addendum Note (Signed)
Addended by: Scharlene GlossEWING, Jahnae Mcadoo B on: 01/23/2018 10:42 AM   Modules accepted: Orders

## 2018-01-23 NOTE — Progress Notes (Signed)
Pre visit review using our clinic review tool, if applicable. No additional management support is needed unless otherwise documented below in the visit note. 

## 2018-01-23 NOTE — Patient Instructions (Addendum)
Continue to push fluids, practice good hand hygiene, and cover your mouth if you cough.  If you start having fevers, shaking or shortness of breath, seek immediate care.  Wait 1 day prior to taking antibiotic.   OK to take Tylenol 1000 mg (2 extra strength tabs) or 975 mg (3 regular strength tabs) every 6 hours as needed.  Ibuprofen 400-600 mg (2-3 over the counter strength tabs) every 6 hours as needed for pain.  If you have drainage from your ear, seek care.  Let us know if you need anything.

## 2018-01-23 NOTE — Progress Notes (Signed)
Chief Complaint  Patient presents with  . Nasal Congestion  . Ear Pain    Clearence CheekLesley Sermons here for URI complaints.  Duration: 6 days  Associated symptoms: sinus congestion, rhinorrhea and ear pain/fullness, cough, wheezing, ST Denies: itchy watery eyes, shortness of breath, myalgia and fevers Treatment to date: theraflu, OTC sinus meds Sick contacts: Yes- son  ROS:  Const: Denies fevers HEENT: As noted in HPI Lungs: No SOB  Past Medical History:  Diagnosis Date  . Anxiety   . Eczema   . History of kidney stones    2009  passed spontaneously   . HSV-2 infection    genital    BP 108/78 (BP Location: Left Arm, Patient Position: Sitting, Cuff Size: Normal)   Pulse 60   Temp 97.8 F (36.6 C) (Oral)   Ht 5\' 5"  (1.651 m)   Wt 192 lb 2 oz (87.1 kg)   SpO2 98%   BMI 31.97 kg/m  General: Awake, alert, appears stated age HEENT: AT, [place], ears patent b/l and TM neg on R, erythematous with serous fluid on R, mild bulging, nares patent w/o discharge, pharynx pink and without exudates, MMM, no sinus ttp Neck: No masses or asymmetry Heart: RRR  Lungs: CTAB, no accessory muscle use Psych: Age appropriate judgment and insight, normal mood and affect  Acute serous otitis media of left ear, recurrence not specified - Plan: amoxicillin (AMOXIL) 875 MG tablet  Viral upper respiratory tract infection  URI leading to AOM. Wait 24 hrs using nsaids and Tylenol, if no better, take.  Continue to push fluids, practice good hand hygiene, cover mouth when coughing. F/u prn. If starting to experience fevers, shaking, drainage from ear, or shortness of breath, seek immediate care. Pt voiced understanding and agreement to the plan.  Jilda Rocheicholas Paul [place], DO 01/23/18 10:30 AM

## 2018-03-30 ENCOUNTER — Ambulatory Visit: Payer: 59 | Admitting: Medical

## 2018-03-30 ENCOUNTER — Ambulatory Visit (HOSPITAL_BASED_OUTPATIENT_CLINIC_OR_DEPARTMENT_OTHER)
Admission: RE | Admit: 2018-03-30 | Discharge: 2018-03-30 | Disposition: A | Discharge status: Home / Self Care | Payer: 59 | Source: Ambulatory Visit | Attending: Medical | Admitting: Medical

## 2018-03-30 ENCOUNTER — Encounter: Payer: Self-pay | Admitting: Medical

## 2018-03-30 VITALS — BP 114/67 | HR 66 | Temp 98.6°F | Resp 16 | Ht 65.0 in | Wt 185.2 lb

## 2018-03-30 DIAGNOSIS — R110 Nausea: Secondary | ICD-10-CM | POA: Diagnosis not present

## 2018-03-30 DIAGNOSIS — R1013 Epigastric pain: Secondary | ICD-10-CM | POA: Diagnosis not present

## 2018-03-30 DIAGNOSIS — R1011 Right upper quadrant pain: Secondary | ICD-10-CM | POA: Diagnosis not present

## 2018-03-30 LAB — COMPREHENSIVE METABOLIC PANEL
ALT: 15 U/L (ref 0–35)
AST: 16 U/L (ref 0–37)
Albumin: 4.5 g/dL (ref 3.5–5.2)
Alkaline Phosphatase: 59 U/L (ref 39–117)
BILIRUBIN TOTAL: 0.6 mg/dL (ref 0.2–1.2)
BUN: 14 mg/dL (ref 6–23)
CO2: 28 meq/L (ref 19–32)
CREATININE: 0.8 mg/dL (ref 0.40–1.20)
Calcium: 9.9 mg/dL (ref 8.4–10.5)
Chloride: 104 mEq/L (ref 96–112)
GFR: 80.67 mL/min (ref >60.00)
Glucose, Bld: 79 mg/dL (ref 70–99)
POTASSIUM: 4.6 meq/L (ref 3.5–5.1)
Sodium: 141 mEq/L (ref 135–145)
Total Protein: 7 g/dL (ref 6.0–8.3)

## 2018-03-30 LAB — CBC WITH DIFFERENTIAL/PLATELET
BASOS PCT: 0.6 % (ref 0.0–3.0)
Basophils Absolute: 0 10*3/uL (ref 0.0–0.1)
EOS ABS: 0.1 10*3/uL (ref 0.0–0.7)
EOS PCT: 1 % (ref 0.0–5.0)
HCT: 43.7 % (ref 36.0–46.0)
HEMOGLOBIN: 14.8 g/dL (ref 12.0–15.0)
LYMPHS ABS: 1.6 10*3/uL (ref 0.7–4.0)
Lymphocytes Relative: 23.1 % (ref 12.0–46.0)
MCHC: 33.8 g/dL (ref 30.0–36.0)
MCV: 86.1 fl (ref 78.0–100.0)
Monocytes Absolute: 0.6 10*3/uL (ref 0.1–1.0)
Monocytes Relative: 8 % (ref 3.0–12.0)
NEUTROS PCT: 67.3 % (ref 43.0–77.0)
Neutro Abs: 4.7 10*3/uL (ref 1.4–7.7)
PLATELETS: 318 10*3/uL (ref 150.0–400.0)
RBC: 5.08 Mil/uL (ref 3.87–5.11)
RDW: 14.2 % (ref 11.5–15.5)
WBC: 7 10*3/uL (ref 4.0–10.5)

## 2018-03-30 LAB — AMYLASE: AMYLASE: 28 U/L (ref 27–131)

## 2018-03-30 LAB — LIPASE: Lipase: 26 U/L (ref 11.0–59.0)

## 2018-03-30 MED ORDER — ONDANSETRON 8 MG PO TBDP: 8.0000 mg | ORAL_TABLET | Freq: Three times a day (TID) | ORAL | 0 refills | Status: DC | PRN

## 2018-03-30 MED ORDER — OMEPRAZOLE 20 MG PO CPDR: 20.0000 mg | DELAYED_RELEASE_CAPSULE | Freq: Every day | ORAL | 3 refills | Status: DC

## 2018-03-30 NOTE — Patient Instructions (Signed)
For your abdomen region pain, I want you to eat bland/healthy diet and start omeprazole.  With recent signs/symptoms want to get cbc, cmp, amylase, lipase, h pylori studies and abd ultrasound.  Labs today and get [place] scheduled as soon as possible. Let me know if can't be done by tomorrow.  If symptom worsen or change let me know. If severe and after hours then ED eval.  Follow up in 7-10 days or as needed

## 2018-03-30 NOTE — Progress Notes (Signed)
Subjective:    Patient ID: Valerie Small, female    DOB: 06/25/1980, 38 y.o.   MRN: [id number]  HPI  Pt in states all last week some epigastric area pain with some burning up go her throat/suprasternal notch area. She states mostly just in her epigastric area. But this am mild burning sensation in suprasternal notch area only this morning.  Pain in her epigastric area was present constant despite. Eating did not make it worse or better.  This morning had some constant right upper quadrant.  Pt states stool looks little yellow recently but no diarrhea.  This past week took zantac but did not help.  Pt did feel some nausea today. She notes she had tubal ligation last year.Marland Kitchen LMP- December 30,2019  Review of Systems  Constitutional: Positive for chills. Negative for fatigue and fever.       Some chills at her office indoors /72 degrees.  Respiratory: Negative for cough, chest tightness, shortness of breath and wheezing.   Cardiovascular: Negative for chest pain and palpitations.  Gastrointestinal: Positive for abdominal pain. Negative for abdominal distention, blood in stool, constipation, diarrhea, rectal pain and vomiting.       Early last week some loose stools but none past 3-5 days.  Genitourinary: Negative for dysuria.  Musculoskeletal: Negative for back pain.  Skin: Negative for rash.  Neurological: Negative for dizziness, speech difficulty, weakness and headaches.  Hematological: Negative for adenopathy.  Psychiatric/Behavioral: Negative for behavioral problems and confusion.    Past Medical History:  Diagnosis Date  . Anxiety   . Eczema   . History of kidney stones    2009  passed spontaneously   . HSV-2 infection    genital     Social History   Socioeconomic History  . Marital status: Married    Spouse name: Not on file  . Number of children: 1  . Years of education: associates  . Highest education level: Not on file  Occupational History  . Occupation:  Clinical biochemist  Social Needs  . Financial resource strain: Not on file  . Food insecurity:    Worry: Not on file    Inability: Not on file  . Transportation needs:    Medical: Not on file    Non-medical: Not on file  Tobacco Use  . Smoking status: Never Smoker  . Smokeless tobacco: Never Used  Substance and Sexual Activity  . Alcohol use: Yes    Alcohol/week: 0.0 standard drinks    Comment: 2 drinks per day  . Drug use: No  . Sexual activity: Not on file  Lifestyle  . Physical activity:    Days per week: Not on file    Minutes per session: Not on file  . Stress: Not on file  Relationships  . Social connections:    Talks on phone: Not on file    Gets together: Not on file    Attends religious service: Not on file    Active member of club or organization: Not on file    Attends meetings of clubs or organizations: Not on file    Relationship status: Not on file  . Intimate partner violence:    Fear of current or ex partner: Not on file    Emotionally abused: Not on file    Physically abused: Not on file    Forced sexual activity: Not on file  Other Topics Concern  . Not on file  Social History Narrative   Married, lives with husband in  one story home   1 child, 2 dogs    Past Surgical History:  Procedure Laterality Date  . LAPAROSCOPIC TUBAL LIGATION Bilateral 01/02/2017   Procedure: LAPAROSCOPIC TUBAL LIGATION;  Surgeon: Edwinna AreolaBanga, Cecilia Worema, DO;  Location: [facility];  Service: Gynecology;  Laterality: Bilateral;  Request RNFA  . WISDOM TOOTH EXTRACTION  2001    Family History  Problem Relation Age of Onset  . Hypertension Mother   . Hypertension Father   . Diabetes Maternal Aunt     No Known Allergies  Current Outpatient Medications on File Prior to Visit  Medication Sig Dispense Refill  . valACYclovir (VALTREX) 500 MG tablet Take 500 mg by mouth as needed.     No current facility-administered medications on file prior to visit.      BP 114/67   Pulse 66   Temp 98.6 F (37 C) (Oral)   Resp 16   Ht 5\' 5"  (1.651 m)   Wt 185 lb 3.2 oz (84 kg)   SpO2 100%   BMI 30.82 kg/m       Objective:   Physical Exam  General- No acute distress. Pleasant patient. Neck- Full range of motion, no jvd Lungs- Clear, even and unlabored. Heart- regular rate and rhythm. Neurologic- CNII- XII grossly intact.  Abdomen-soft, nondistended, +bs. No rebound. No epigastric tenderness. Reports slight burn. Rt upper quadrant mild tender to palpation.      Assessment & Plan:  For your abdomen region pain, I want you to eat bland/healthy diet and start omeprazole.  With recent signs/symptoms want to get cbc, cmp, amylase, lipase, h pylori studies and abd ultrasound.  Labs today and get US scheduled as soon as possible. Let me know if can't be done by tomorrow.  If symptom worsen or change let me know. If severe and after hours then ED eval.  Follow up in 7-10 days or as needed  Whole FoodsEdward Deklan Minar, PA-C

## 2018-03-31 ENCOUNTER — Encounter: Payer: Self-pay | Admitting: Medical

## 2018-03-31 LAB — H. PYLORI BREATH TEST: H. PYLORI BREATH TEST: NOT DETECTED

## 2018-04-06 ENCOUNTER — Telehealth: Payer: Self-pay | Admitting: Medical

## 2018-04-06 DIAGNOSIS — R1013 Epigastric pain: Secondary | ICD-10-CM

## 2018-04-06 DIAGNOSIS — R1011 Right upper quadrant pain: Secondary | ICD-10-CM

## 2018-04-06 NOTE — Telephone Encounter (Signed)
Referral to gi placed. °

## 2018-04-09 ENCOUNTER — Ambulatory Visit: Payer: 59 | Admitting: Gastroenterology

## 2018-04-09 ENCOUNTER — Encounter: Payer: Self-pay | Admitting: Gastroenterology

## 2018-04-09 VITALS — BP 122/68 | HR 70 | Ht 65.0 in | Wt 188.0 lb

## 2018-04-09 DIAGNOSIS — R110 Nausea: Secondary | ICD-10-CM | POA: Diagnosis not present

## 2018-04-09 DIAGNOSIS — R1011 Right upper quadrant pain: Secondary | ICD-10-CM | POA: Diagnosis not present

## 2018-04-09 DIAGNOSIS — K828 Other specified diseases of gallbladder: Secondary | ICD-10-CM

## 2018-04-09 DIAGNOSIS — R12 Heartburn: Secondary | ICD-10-CM | POA: Diagnosis not present

## 2018-04-09 MED ORDER — ONDANSETRON HCL 4 MG PO TABS: 4.0000 mg | ORAL_TABLET | Freq: Four times a day (QID) | ORAL | 0 refills | Status: DC

## 2018-04-09 NOTE — Patient Instructions (Signed)
If you are age 38 or older, your body mass index should be between 23-30. Your Body mass index is 31.28 kg/m. If this is out of the aforementioned range listed, please consider follow up with your Primary Care Provider.  If you are age 364 or younger, your body mass index should be between 19-25. Your Body mass index is 31.28 kg/m. If this is out of the aformentioned range listed, please consider follow up with your Primary Care Provider.   We have sent the following medications to your pharmacy for you to pick up at your convenience: Zofran 4mg  by mouth every 6 hours as needed.  It was a pleasure to see you today!  Vito Cirigliano, D.O.

## 2018-04-09 NOTE — Progress Notes (Signed)
Chief Complaint: Abdominal pain, heartburn  Referring Provider:     Esperanza Richters, PA-C  HPI:    Magdalyn Small is a 38 y.o. female referred to the [facility]ology Clinic for evaluation of RUQ abdominal pain and heartburn.  She states pain is mild and intermittent, short lived and not impacting QOL. Started approx 3 weeks ago. Did have radiation to b/t shoulder blades a couple times, but none in the last week or so. No exacerbation with PO, can occur with fasting as well. No prior similar sxs.    Has started Prilosec and Zofran with improvement in indigestion and nausea. Rare reflux sxs over the last few weeks as well. Nausea, but no emesis. No overt GIB. No dysphagia or odynophagia.  Overall, she states she is improving and was asymptomatic yesterday to the point where she considered canceling this appointment today.  Evaluation to date has been largely unrevealing to include normal CBC, CMP, amylase, lipase, H. Pylori.  Abdominal ultrasound notable for GB sludge, possible 2 mm stone in the GB neck, no cholecystitis, normal CBD, normal liver and pancreas.  Mother and maternal aunt with diverticulosis. No known family history of CRC, GI malignancy, liver disease, pancreatic disease, or IBD.   Past Medical History:  Diagnosis Date  . Anxiety   . Eczema   . History of kidney stones    2009  passed spontaneously   . HSV-2 infection    genital     Past Surgical History:  Procedure Laterality Date  . LAPAROSCOPIC TUBAL LIGATION Bilateral 01/02/2017   Procedure: LAPAROSCOPIC TUBAL LIGATION;  Surgeon: Valerie Areola, DO;  Location: [facility];  Service: Gynecology;  Laterality: Bilateral;  Request RNFA  . WISDOM TOOTH EXTRACTION  2001   Family History  Problem Relation Age of Onset  . Hypertension Mother   . Hypertension Father   . Diabetes Maternal Aunt   . Colon cancer Neg Hx    Social History   Tobacco Use  . Smoking status: Never Smoker   . Smokeless tobacco: Never Used  Substance Use Topics  . Alcohol use: Yes    Alcohol/week: 0.0 standard drinks    Comment: 2 drinks per day  . Drug use: No   Current Outpatient Medications  Medication Sig Dispense Refill  . omeprazole (PRILOSEC) 20 MG capsule Take 1 capsule (20 mg total) by mouth daily. 30 capsule 3  . ondansetron (ZOFRAN ODT) 8 MG disintegrating tablet Take 1 tablet (8 mg total) by mouth every 8 (eight) hours as needed for nausea or vomiting. 20 tablet 0  . valACYclovir (VALTREX) 500 MG tablet Take 500 mg by mouth as needed.     No current facility-administered medications for this visit.    No Known Allergies   Review of Systems: All systems reviewed and negative except where noted in HPI.     Physical Exam:    Wt Readings from Last 3 Encounters:  04/09/18 188 lb (85.3 kg)  03/30/18 185 lb 3.2 oz (84 kg)  01/23/18 192 lb 2 oz (87.1 kg)    BP 122/68   Pulse 70   Ht 5\' 5"  (1.651 m)   Wt 188 lb (85.3 kg)   BMI 31.28 kg/m  Constitutional:  Pleasant, in no acute distress. Psychiatric: Normal mood and affect. Behavior is normal. EENT: Pupils normal.  Conjunctivae are normal. No scleral icterus. Neck supple. No cervical LAD. Cardiovascular: Normal rate, regular rhythm. No  edema Pulmonary/chest: Effort normal and breath sounds normal. No wheezing, rales or rhonchi. Abdominal: Soft, nondistended, nontender. Bowel sounds active throughout. There are no masses palpable. No hepatomegaly. Neurological: Alert and oriented to person place and time. Skin: Skin is warm and dry. No rashes noted.   ASSESSMENT AND PLAN;   Valerie Small is a 38 y.o. female presenting with:  1) RUQ pain: Intermittent, brief, nonlimiting RUQ pain with a normal serologic evaluation.  Ultrasound notable for GB sludge and possibly small 2 mm stone without cholecystitis or CDL.  Discussed the broad DDX for RUQ pain to include luminal, mucosal, hepatobiliary etiologies.  Given her overall  clinical improvement, she would like to proceed with conservative management and observation at this time.  Given her hemodynamic stability, patient reliability to return, feel this is reasonable.  - Resume Prilosec and antiemetics prn with goal to titrate off completely -If symptoms return or worsen, to call the office and we will likely plan for EGD to evaluate for mucosal/luminal etiology.  If unrevealing, consider further evaluation with HIDA -She understands parameters to call or RTC  2) Cholelithiasis: Sludge and perhaps a 2 mm stone noted in GB on ultrasound.  Otherwise no evidence of obstruction or cholecystitis.  As above, given overall clinical improvement and stability, she'd like to proceed with conservative management alone without further radiographic evaluation or referral to a surgeon.  To call if clinical change.  3) Heartburn: Very rare intermittent episodes of heartburn over the last 3 weeks, also improved since starting Prilosec. -Resume Prilosec at current dose with plan to titrate to lowest effective dose or off completely if possible  RTC PRN  I spent a total of 30 minutes of face-to-face time with the patient. Greater than 50% of the time was spent counseling and coordinating care.    Valerie Cleverly, DO, FACG  04/09/2018, 9:42 AM   [place], Valerie Dredge, PA-C

## 2019-07-22 ENCOUNTER — Other Ambulatory Visit: Payer: Self-pay

## 2019-07-22 ENCOUNTER — Ambulatory Visit: Payer: 59 | Admitting: Medical

## 2019-07-22 VITALS — BP 125/75 | HR 67 | Temp 97.0°F | Resp 18 | Ht 64.0 in | Wt 195.6 lb

## 2019-07-22 DIAGNOSIS — N912 Amenorrhea, unspecified: Secondary | ICD-10-CM

## 2019-07-22 DIAGNOSIS — R5383 Other fatigue: Secondary | ICD-10-CM

## 2019-07-22 DIAGNOSIS — N926 Irregular menstruation, unspecified: Secondary | ICD-10-CM | POA: Diagnosis not present

## 2019-07-22 LAB — CBC WITH DIFFERENTIAL/PLATELET
Basophils Absolute: 0 10*3/uL (ref 0.0–0.1)
Basophils Relative: 0.6 % (ref 0.0–3.0)
Eosinophils Absolute: 0.2 10*3/uL (ref 0.0–0.7)
Eosinophils Relative: 2.6 % (ref 0.0–5.0)
HCT: 41.8 % (ref 36.0–46.0)
Hemoglobin: 14.1 g/dL (ref 12.0–15.0)
Lymphocytes Relative: 20.4 % (ref 12.0–46.0)
Lymphs Abs: 1.3 10*3/uL (ref 0.7–4.0)
MCHC: 33.8 g/dL (ref 30.0–36.0)
MCV: 86 fl (ref 78.0–100.0)
Monocytes Absolute: 0.5 10*3/uL (ref 0.1–1.0)
Monocytes Relative: 7.9 % (ref 3.0–12.0)
Neutro Abs: 4.3 10*3/uL (ref 1.4–7.7)
Neutrophils Relative %: 68.5 % (ref 43.0–77.0)
Platelets: 270 10*3/uL (ref 150.0–400.0)
RBC: 4.86 Mil/uL (ref 3.87–5.11)
RDW: 14.1 % (ref 11.5–15.5)
WBC: 6.3 10*3/uL (ref 4.0–10.5)

## 2019-07-22 LAB — COMPREHENSIVE METABOLIC PANEL
ALT: 15 U/L (ref 0–35)
AST: 15 U/L (ref 0–37)
Albumin: 4.1 g/dL (ref 3.5–5.2)
Alkaline Phosphatase: 66 U/L (ref 39–117)
BUN: 14 mg/dL (ref 6–23)
CO2: 29 mEq/L (ref 19–32)
Calcium: 8.8 mg/dL (ref 8.4–10.5)
Chloride: 102 mEq/L (ref 96–112)
Creatinine, Ser: 0.73 mg/dL (ref 0.40–1.20)
GFR: 89.03 mL/min (ref >60.00)
Glucose, Bld: 80 mg/dL (ref 70–99)
Potassium: 4 mEq/L (ref 3.5–5.1)
Sodium: 136 mEq/L (ref 135–145)
Total Bilirubin: 0.5 mg/dL (ref 0.2–1.2)
Total Protein: 6.4 g/dL (ref 6.0–8.3)

## 2019-07-22 LAB — TSH: TSH: 1.64 u[IU]/mL (ref 0.35–4.50)

## 2019-07-22 LAB — FOLLICLE STIMULATING HORMONE: FSH: 3.4 m[IU]/mL

## 2019-07-22 LAB — T4, FREE: Free T4: 0.89 ng/dL (ref 0.60–1.60)

## 2019-07-22 LAB — POCT URINE PREGNANCY: Preg Test, Ur: NEGATIVE

## 2019-07-22 LAB — VITAMIN B12: Vitamin B-12: 342 pg/mL (ref 211–911)

## 2019-07-22 NOTE — Progress Notes (Signed)
Subjective:    Patient ID: Valerie Small, female    DOB: [birth year], 39 y.o.   MRN: [id number]  HPI   Pt in for evaluation.   Pt states her last cycle was June 01, 2019. Lasted about 5 days. Her menses has been regular for a long time. Pt saw her gyn in past year. She informed her gyn and they told her to come in for blood work. After labs told would wait 3 months and then might consider progesterone.  No lactation. No known eating disorder or wt loss.  The blood work is not visible. Done at [facility].   Pt has hx of tubal ligation. Pregnancy test is negative. No family hx of premature menopause but mom had hysterectomy   Pt has some GI issues that have been worked up. Pt saw GI and they gave her pepcid it helped. Pt decided not to go through with endoscopy since she felt better.   Pt has some fatigue. Sister has thyroid disorder.    Review of Systems  Constitutional: Positive for fatigue.       Sometimes skin feels dry.  HENT: Negative for congestion and drooling.   Respiratory: Negative for cough, chest tightness, shortness of breath and wheezing.   Cardiovascular: Negative for chest pain and palpitations.  Gastrointestinal:       Abdomen pain is resolved.  Endocrine: Negative for polydipsia, polyphagia and polyuria.  Genitourinary: Positive for menstrual problem. Negative for dyspareunia, vaginal bleeding, vaginal discharge and vaginal pain.  Neurological: Negative for dizziness, tremors, seizures, weakness and light-headedness.  Hematological: Negative for adenopathy. Does not bruise/bleed easily.  Psychiatric/Behavioral: Negative for behavioral problems and confusion.    Past Medical History:  Diagnosis Date  . Anxiety   . Eczema   . History of kidney stones    2009  passed spontaneously   . HSV-2 infection    genital     Social History   Socioeconomic History  . Marital status: Married    Spouse name: Not on file  . Number of children: 1  . Years of  education: associates  . Highest education level: Not on file  Occupational History  . Occupation: Clinical biochemist  Tobacco Use  . Smoking status: Never Smoker  . Smokeless tobacco: Never Used  Substance and Sexual Activity  . Alcohol use: Yes    Alcohol/week: 0.0 standard drinks    Comment: 2 drinks per day  . Drug use: No  . Sexual activity: Not on file  Other Topics Concern  . Not on file  Social History Narrative   Married, lives with husband in one story home   1 child, 2 dogs   Social Determinants of Health   Financial Resource Strain:   . Difficulty of Paying Living Expenses:   Food Insecurity:   . Worried About Programme researcher, broadcasting/film/video in the Last Year:   . Barista in the Last Year:   Transportation Needs:   . Freight forwarder (Medical):   Marland Kitchen Lack of Transportation (Non-Medical):   Physical Activity:   . Days of Exercise per Week:   . Minutes of Exercise per Session:   Stress:   . Feeling of Stress :   Social Connections:   . Frequency of Communication with Friends and Family:   . Frequency of Social Gatherings with Friends and Family:   . Attends Religious Services:   . Active Member of Clubs or Organizations:   . Attends Banker  Meetings:   Marland Kitchen Marital Status:   Intimate Partner Violence:   . Fear of Current or Ex-Partner:   . Emotionally Abused:   Marland Kitchen Physically Abused:   . Sexually Abused:     Past Surgical History:  Procedure Laterality Date  . LAPAROSCOPIC TUBAL LIGATION Bilateral 01/02/2017   Procedure: LAPAROSCOPIC TUBAL LIGATION;  Surgeon: Sherlyn Hay, DO;  Location: [place];  Service: Gynecology;  Laterality: Bilateral;  Request RNFA  . WISDOM TOOTH EXTRACTION  2001    Family History  Problem Relation Age of Onset  . Hypertension Mother   . Hypertension Father   . Diabetes Maternal Aunt   . Colon cancer Neg Hx     No Known Allergies  Current Outpatient Medications on File Prior to Visit    Medication Sig Dispense Refill  . omeprazole (PRILOSEC) 20 MG capsule Take 1 capsule (20 mg total) by mouth daily. (Patient not taking: Reported on 07/22/2019) 30 capsule 3  . ondansetron (ZOFRAN ODT) 8 MG disintegrating tablet Take 1 tablet (8 mg total) by mouth every 8 (eight) hours as needed for nausea or vomiting. (Patient not taking: Reported on 07/22/2019) 20 tablet 0  . ondansetron (ZOFRAN) 4 MG tablet Take 1 tablet (4 mg total) by mouth every 6 (six) hours. (Patient not taking: Reported on 07/22/2019) 20 tablet 0  . valACYclovir (VALTREX) 500 MG tablet Take 500 mg by mouth as needed.     No current facility-administered medications on file prior to visit.    BP 125/75 (BP Location: Right Arm, Patient Position: Sitting, Cuff Size: Large)   Pulse 67   Temp (!) 97 F (36.1 C) (Temporal)   Resp 18   Ht 5\' 4"  (1.626 m)   Wt 195 lb 9.6 oz (88.7 kg)   LMP 06/01/2019   SpO2 100%   BMI 33.57 kg/m       Objective:   Physical Exam  General Mental Status- Alert. General Appearance- Not in acute distress.   Skin General: Color- Normal Color. Moisture- Normal Moisture.  Neck Carotid Arteries- Normal color. Moisture- Normal Moisture. No carotid bruits. No JVD.  Chest and Lung Exam Auscultation: Breath Sounds:-Normal.  Cardiovascular Auscultation:Rythm- Regular. Murmurs & Other Heart Sounds:Auscultation of the heart reveals- No Murmurs.  Abdomen Inspection:-Inspeection Normal. Palpation/Percussion:Note:No mass. Palpation and Percussion of the abdomen reveal- Non Tender, Non Distended + BS, no rebound or guarding.   Neurologic Cranial Nerve exam:- CN III-XII intact(No nystagmus), symmetric smile. Strength:- 5/5 equal and symmetric strength both upper and lower extremities.      Assessment & Plan:  Late menses since 06-01-2019.  Your gynecologist has already done pregnancy test but we wanted you to wait till June before considering Provera.  I do not see for work-up that  they did and you report not seeing any additional labs.  We will go ahead and get [facility] level today and thyroid studies.  In addition you report some fatigue so will include CBC, CMP, B12, B1 and vitamin D.  If he had any recurrent abdomen pain recommend that you use Pepcid over-the-counter again.  Let us know if this not working.  We will follow your labs and notify you of those results.  Then we might need to communicate to you do our office and see if they would okay prescribing Provera 10 mg for 7 to 10 days now rather than waiting until June.  Follow-up date to be determined after lab review.  Mackie Pai, PA-C   Time spent  with patient today was 30  minutes which consisted of chart reiew, discussing diagnoses, work up, treatment and documentation.

## 2019-07-22 NOTE — Patient Instructions (Signed)
Late menses since 06-01-2019.  Your gynecologist has already done pregnancy test but we wanted you to wait till June before considering Provera.  I do not see for work-up that they did and you report not seeing any additional labs.  We will go ahead and get [facility] level today and thyroid studies.  In addition you report some fatigue so will include CBC, CMP, B12, B1 and vitamin D.  If he had any recurrent abdomen pain recommend that you use Pepcid over-the-counter again.  Let us know if this not working.  We will follow your labs and notify you of those results.  Then we might need to communicate to you do our office and see if they would okay prescribing Provera 10 mg for 7 to 10 days now rather than waiting until June.  Follow-up date to be determined after lab review.

## 2019-07-25 LAB — VITAMIN B1: Vitamin B1 (Thiamine): 7 nmol/L — ABNORMAL LOW (ref 8–30)

## 2019-07-26 LAB — VITAMIN D 1,25 DIHYDROXY
Vitamin D 1, 25 (OH)2 Total: 59 pg/mL (ref 18–72)
Vitamin D2 1, 25 (OH)2: <8 pg/mL
Vitamin D3 1, 25 (OH)2: 59 pg/mL

## 2020-05-16 ENCOUNTER — Encounter: Payer: Self-pay | Admitting: Gastroenterology

## 2020-05-16 ENCOUNTER — Other Ambulatory Visit: Payer: Self-pay

## 2020-05-16 ENCOUNTER — Other Ambulatory Visit (INDEPENDENT_AMBULATORY_CARE_PROVIDER_SITE_OTHER): Payer: 59

## 2020-05-16 ENCOUNTER — Ambulatory Visit: Payer: 59 | Admitting: Gastroenterology

## 2020-05-16 VITALS — BP 126/70 | HR 82 | Ht 65.0 in | Wt 199.0 lb

## 2020-05-16 DIAGNOSIS — R110 Nausea: Secondary | ICD-10-CM

## 2020-05-16 DIAGNOSIS — R140 Abdominal distension (gaseous): Secondary | ICD-10-CM

## 2020-05-16 DIAGNOSIS — R197 Diarrhea, unspecified: Secondary | ICD-10-CM

## 2020-05-16 DIAGNOSIS — R195 Other fecal abnormalities: Secondary | ICD-10-CM

## 2020-05-16 DIAGNOSIS — R1084 Generalized abdominal pain: Secondary | ICD-10-CM

## 2020-05-16 DIAGNOSIS — K8020 Calculus of gallbladder without cholecystitis without obstruction: Secondary | ICD-10-CM

## 2020-05-16 DIAGNOSIS — K5900 Constipation, unspecified: Secondary | ICD-10-CM

## 2020-05-16 MED ORDER — OMEPRAZOLE 20 MG PO CPDR: 20.0000 mg | DELAYED_RELEASE_CAPSULE | Freq: Every day | ORAL | 3 refills | Status: DC

## 2020-05-16 MED ORDER — ONDANSETRON 8 MG PO TBDP: 8.0000 mg | ORAL_TABLET | Freq: Four times a day (QID) | ORAL | 1 refills | Status: DC | PRN

## 2020-05-16 NOTE — Addendum Note (Signed)
Addended by: Rosita Kea on: 05/16/2020 03:48 PM   Modules accepted: Orders

## 2020-05-16 NOTE — Patient Instructions (Signed)
If you are age 40 or older, your body mass index should be between 23-30. Your Body mass index is 33.12 kg/m. If this is out of the aforementioned range listed, please consider follow up with your Primary Care Provider.  If you are age 85 or younger, your body mass index should be between 19-25. Your Body mass index is 33.12 kg/m. If this is out of the aformentioned range listed, please consider follow up with your Primary Care Provider.   Please go to the lab on the 2nd floor [address] before you leave the office today.   Please purchase the following medications over the counter and take as directed: Probiotic- Align  We have sent the following medications to your pharmacy for you to pick up at your convenience: Zofran Omeprazole  We will call with your results.   It was a pleasure to see you today!  Vito Cirigliano, D.O.

## 2020-05-16 NOTE — Progress Notes (Signed)
P  Chief Complaint:    Generalized abdominal pain, change in bowel habits, reflux, bloating  GI History: 40 year old female initially seen 04/09/2018 for evaluation of RUQ pain and heartburn.  Symptoms were mild in nature and self-limiting.  Evaluation largely unrevealing to include: -Normal CBC, CMP, amylase, lipase, H. pylori -Abdominal ultrasound: GB sludge, possible 2 mm stone in GB neck without cholecystitis.  Normal CBD, liver, pancreas -Was managed conservatively with short course of Prilosec and antiemetics prn, with plan to titrate off   Mother and maternal aunt with diverticulosis. No known family history of CRC, GI malignancy, liver disease, pancreatic disease, or IBD.   HPI:     Patient is a 40 y.o. female presenting to the [facility]enterology Clinic for follow-up.  Initially seen by me in 03/2018 as outlined above.  States sxs from 2020 resolved, but started having alternating bowel habits a couple times over the last several months.  She has an underlying history of constipation for 12+ years, which she typically manages well with high-fiber diet, increase water intake, and Pepto dismal  prn.  However, has had episodes of watery, nonbloody diarrhea, typically lasting 3-4 days.  Symptoms are bothersome enough to either leave work or not go to work.  More recently, has had generalized abdominal pain/LUQ pain, abdominal bloating, nausea without emesis over the last week or so, which prompted her to make this appointment.  No hematochezia, melena.  No fever, chills, weight loss.  No preceding changes in diet, medications, activity, etc.  Perhaps some increased stressors, including trying to buy a house, work/schedule related issues.  Started probiotic with some improvement in pain.  Requesting refill for Zofran which she received back in 2020, along with Prilosec to trial.  Last set of labs from 07/2019 were unrevealing, to include normal CBC, CMP, TSH, B12, vitamin D.  Vitamin B1  was low and started OTC B1.    Review of systems:     No chest pain, no SOB, no fevers, no urinary sx   Past Medical History:  Diagnosis Date  . Anxiety   . Eczema   . History of kidney stones    2009  passed spontaneously   . HSV-2 infection    genital    Patient's surgical history, family medical history, social history, medications and allergies were all reviewed in Epic    Current Outpatient Medications  Medication Sig Dispense Refill  . omeprazole (PRILOSEC) 20 MG capsule Take 1 capsule (20 mg total) by mouth daily. (Patient not taking: Reported on 07/22/2019) 30 capsule 3  . ondansetron (ZOFRAN ODT) 8 MG disintegrating tablet Take 1 tablet (8 mg total) by mouth every 8 (eight) hours as needed for nausea or vomiting. (Patient not taking: Reported on 07/22/2019) 20 tablet 0  . ondansetron (ZOFRAN) 4 MG tablet Take 1 tablet (4 mg total) by mouth every 6 (six) hours. (Patient not taking: Reported on 07/22/2019) 20 tablet 0  . valACYclovir (VALTREX) 500 MG tablet Take 500 mg by mouth as needed.     No current facility-administered medications for this visit.    Physical Exam:     There were no vitals taken for this visit.  GENERAL:  Pleasant female in NAD PSYCH: : Cooperative, normal affect EENT:  conjunctiva pink, mucous membranes moist, neck supple without masses CARDIAC:  RRR, no murmur heard, no peripheral edema PULM: Normal respiratory effort, lungs CTA bilaterally, no wheezing ABDOMEN:  Nondistended, soft, nontender. No obvious masses, no hepatomegaly,  normal bowel sounds  SKIN:  turgor, no lesions seen Musculoskeletal:  Normal muscle tone, normal strength NEURO: Alert and oriented x 3, no focal neurologic deficits   IMPRESSION and PLAN:    1) Change in bowel habits 2) Constipation/Diarrhea 3) Generalized abdominal pain 4) Abdominal bloating 5) Nausea without emesis  -Discussed broad DDx for her symptoms at length today.  Seemingly most consistent with IBS,  but DDx also includes PUD, gastritis, celiac, microscopic colitis, IBD, etc. -Discussed diagnostic work-up, to include labs, imaging options, along with discussed pros/cons of EGD and colonoscopy.  She would like to proceed with least invasive work-up to start -CBC, CMP, TSH, ESR, CRP -GI PCR panel, fecal calprotectin -Start probiotic, such as align -Provided Rx for Zofran prn along with short trial of Prilosec given previous good response -If labs unrevealing, and symptoms persist, plan for Bentyl or Levsin -If work-up unrevealing and symptoms persist, plan for EGD/colonoscopy along with possible imaging  5) History of cholelithiasis/GB sludge -RUQ [place] in 2020 with 2 mm GB stone and GB sludge.  If symptoms persist, or if elevated enzymes, plan for repeat RUQ [place] and possible HIDA  I spent 32 minutes of time, including in depth chart review, independent review of results as outlined above, communicating results with the patient directly, face-to-face time with the patient, coordinating care, and ordering studies and medications as appropriate, and documentation.  RTC in 3 months or sooner as needed          Lavena Bullion ,DO, FACG 05/16/2020, 2:58 PM

## 2020-05-16 NOTE — Addendum Note (Signed)
Addended by: Latricia Heft A on: 05/16/2020 03:50 PM   Modules accepted: Orders

## 2020-05-17 LAB — CBC WITH DIFFERENTIAL/PLATELET
Basophils Absolute: 0 10*3/uL (ref 0.0–0.2)
Basos: 1 %
EOS (ABSOLUTE): 0.4 10*3/uL (ref 0.0–0.4)
Eos: 5 %
Hematocrit: 42.9 % (ref 34.0–46.6)
Hemoglobin: 14.1 g/dL (ref 11.1–15.9)
Immature Grans (Abs): 0 10*3/uL (ref 0.0–0.1)
Immature Granulocytes: 0 %
Lymphocytes Absolute: 2.1 10*3/uL (ref 0.7–3.1)
Lymphs: 25 %
MCH: 28.1 pg (ref 26.6–33.0)
MCHC: 32.9 g/dL (ref 31.5–35.7)
MCV: 86 fL (ref 79–97)
Monocytes Absolute: 0.7 10*3/uL (ref 0.1–0.9)
Monocytes: 8 %
Neutrophils Absolute: 5 10*3/uL (ref 1.4–7.0)
Neutrophils: 61 %
Platelets: 324 10*3/uL (ref 150–450)
RBC: 5.02 x10E6/uL (ref 3.77–5.28)
RDW: 13.1 % (ref 11.7–15.4)
WBC: 8.2 10*3/uL (ref 3.4–10.8)

## 2020-05-17 LAB — COMPREHENSIVE METABOLIC PANEL
ALT: 27 IU/L (ref 0–32)
AST: 27 IU/L (ref 0–40)
Albumin/Globulin Ratio: 1.5 (ref 1.2–2.2)
Albumin: 4 g/dL (ref 3.8–4.8)
Alkaline Phosphatase: 81 IU/L (ref 44–121)
BUN/Creatinine Ratio: 9 (ref 9–23)
BUN: 7 mg/dL (ref 6–20)
Bilirubin Total: <0.2 mg/dL (ref 0.0–1.2)
CO2: 21 mmol/L (ref 20–29)
Calcium: 9 mg/dL (ref 8.7–10.2)
Chloride: 103 mmol/L (ref 96–106)
Creatinine, Ser: 0.78 mg/dL (ref 0.57–1.00)
Globulin, Total: 2.7 g/dL (ref 1.5–4.5)
Glucose: 83 mg/dL (ref 65–99)
Potassium: 4.4 mmol/L (ref 3.5–5.2)
Sodium: 139 mmol/L (ref 134–144)
Total Protein: 6.7 g/dL (ref 6.0–8.5)
eGFR: 99 mL/min/{1.73_m2} (ref >59)

## 2020-05-17 LAB — SEDIMENTATION RATE: Sed Rate: 8 mm/hr (ref 0–32)

## 2020-05-17 LAB — TSH: TSH: 2.02 u[IU]/mL (ref 0.450–4.500)

## 2020-05-17 LAB — HIGH SENSITIVITY CRP: CRP, High Sensitivity: 2.95 mg/L (ref 0.00–3.00)

## 2020-05-18 ENCOUNTER — Other Ambulatory Visit: Payer: Self-pay

## 2020-05-18 ENCOUNTER — Other Ambulatory Visit: Payer: 59

## 2020-05-18 DIAGNOSIS — R1084 Generalized abdominal pain: Secondary | ICD-10-CM

## 2020-05-18 DIAGNOSIS — R110 Nausea: Secondary | ICD-10-CM

## 2020-05-18 DIAGNOSIS — R197 Diarrhea, unspecified: Secondary | ICD-10-CM

## 2020-05-18 DIAGNOSIS — K5900 Constipation, unspecified: Secondary | ICD-10-CM

## 2020-05-18 DIAGNOSIS — R195 Other fecal abnormalities: Secondary | ICD-10-CM

## 2020-05-19 LAB — CALPROTECTIN, FECAL: Calprotectin, Fecal: 26 ug/g (ref 0–120)

## 2020-05-21 LAB — GI PROFILE, STOOL, PCR

## 2020-09-16 IMAGING — US US ABDOMEN COMPLETE
1 series · 14 of 25 positions shown · non-contrast
Comparison: None.

CLINICAL DATA: Epigastric and right upper quadrant pain

EXAM:
ABDOMEN ULTRASOUND COMPLETE

[Series 1: us abdomen complete · 0.16mm/px · 14 of 78 slices shown]
[im 1/78]
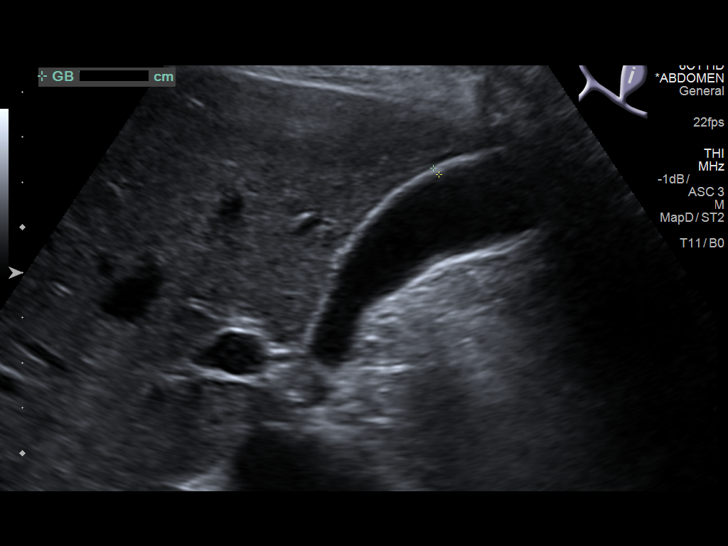
[im 7/78]
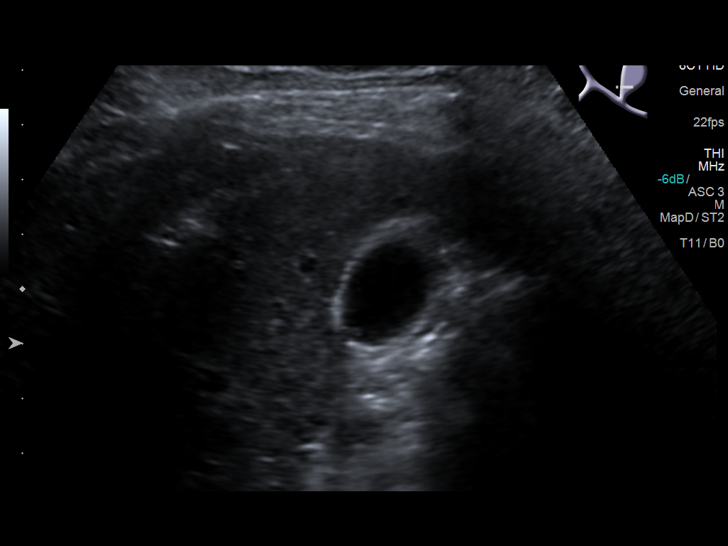
[im 13/78]
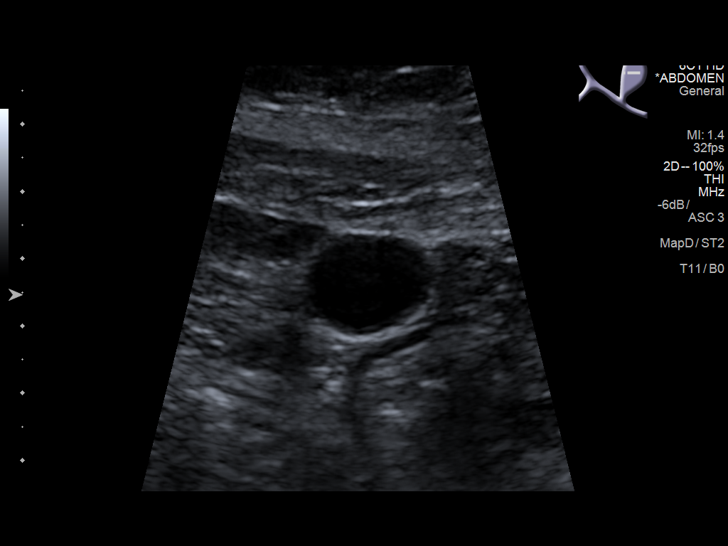
[im 20/78]
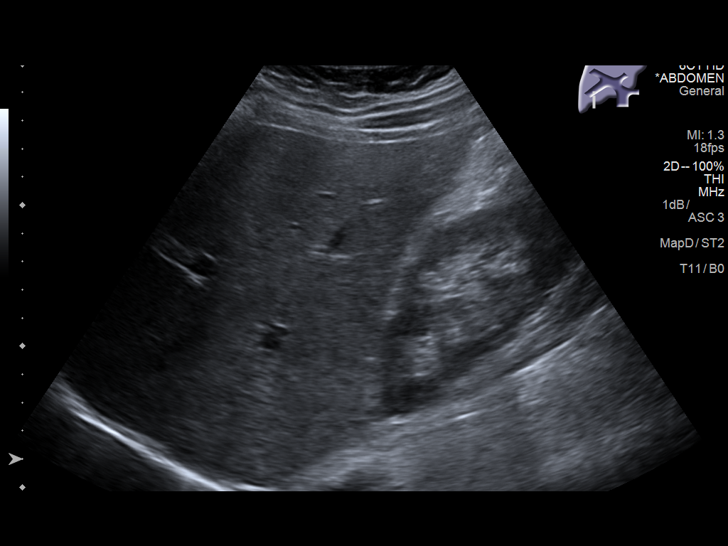
[im 26/78]
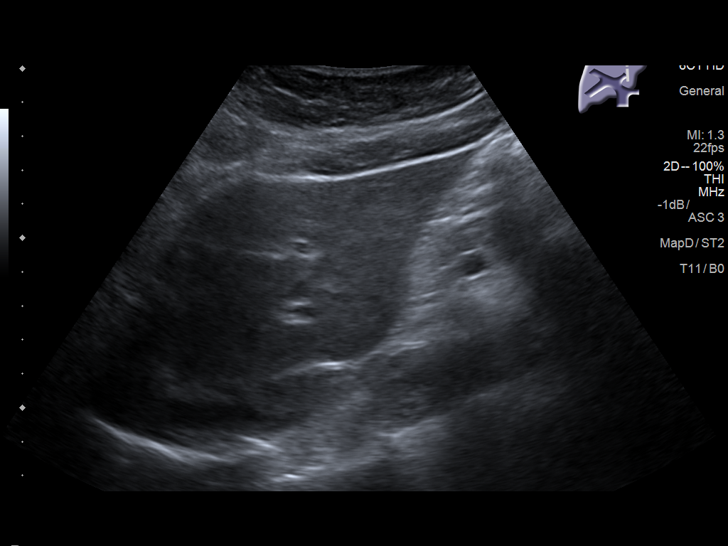
[im 29/78]
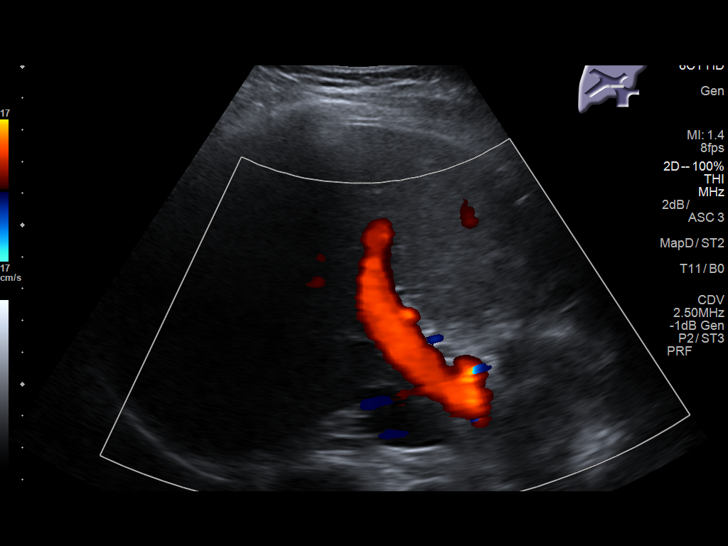
[im 36/78]
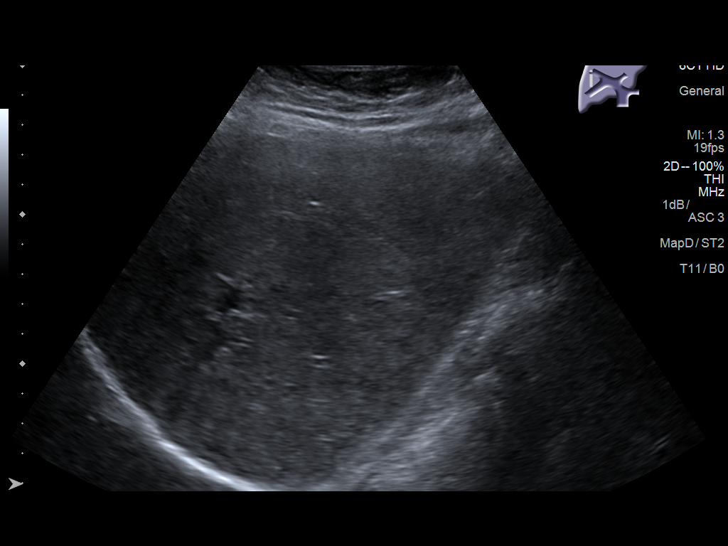
[im 42/78]
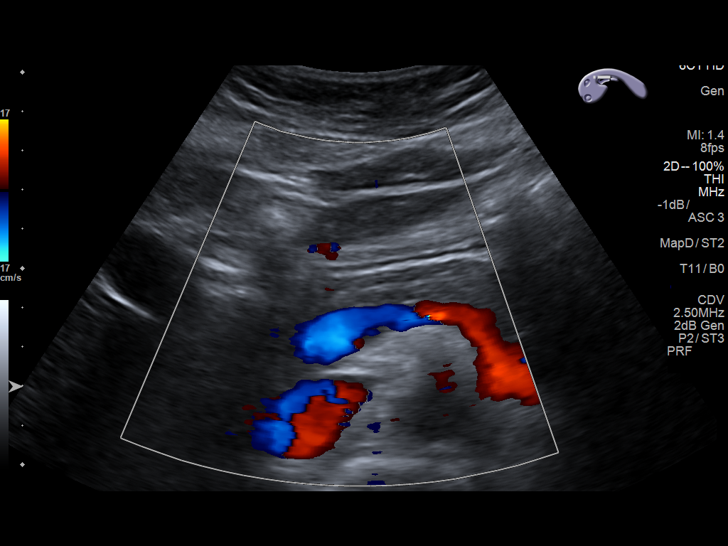
[im 49/78]
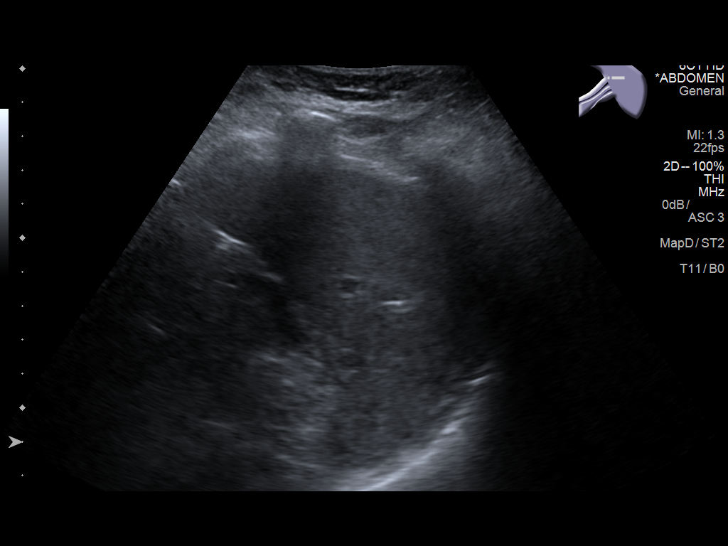
[im 52/78]
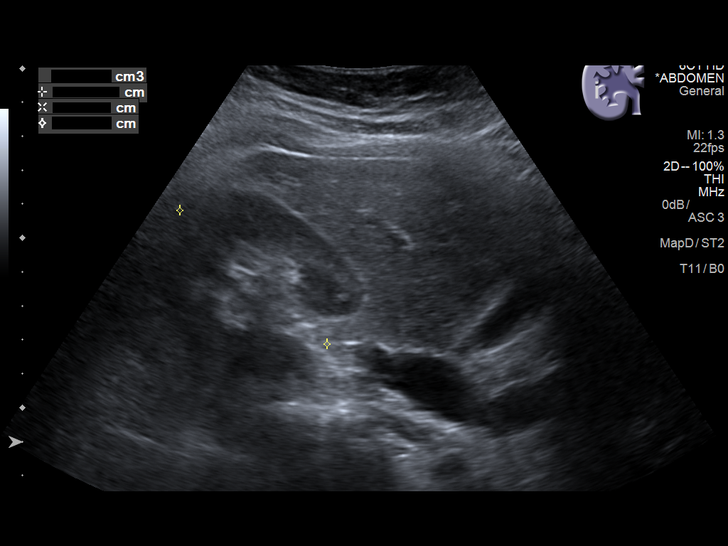
[im 58/78]
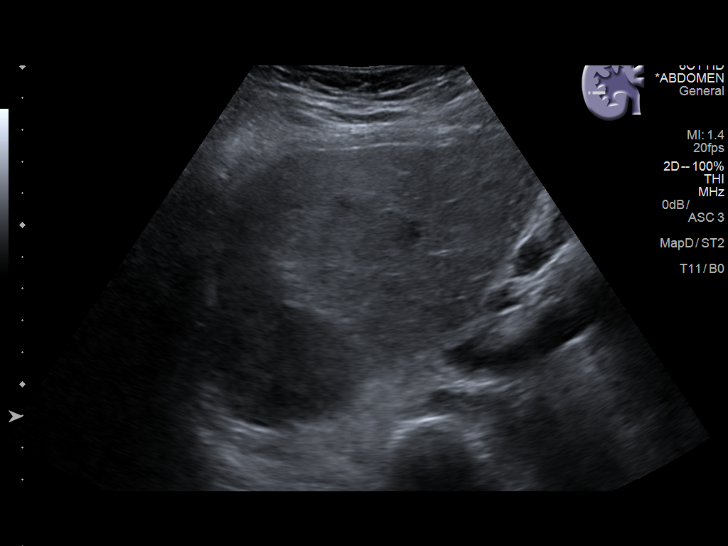
[im 65/78]
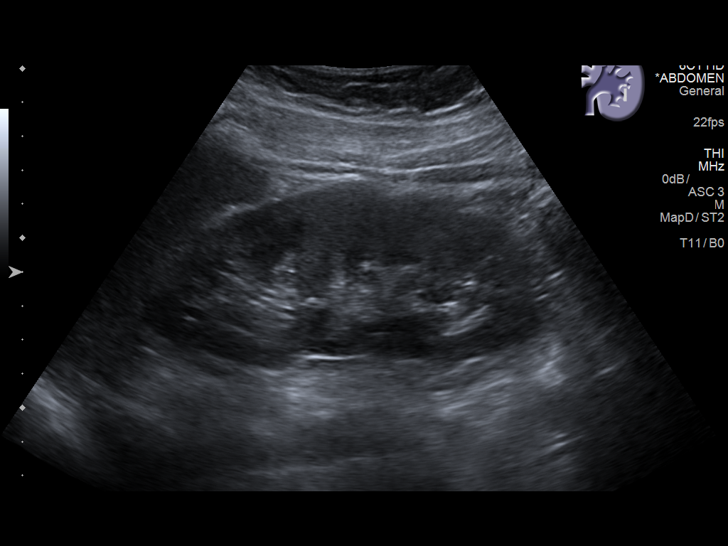
[im 71/78]
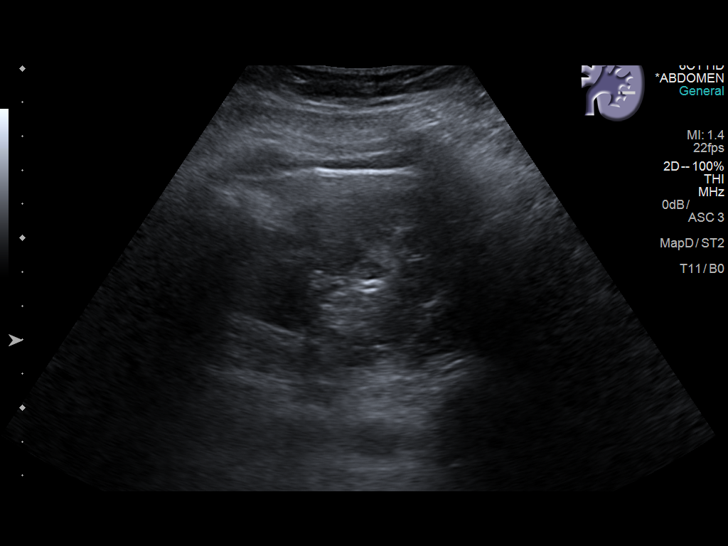
[im 78/78]
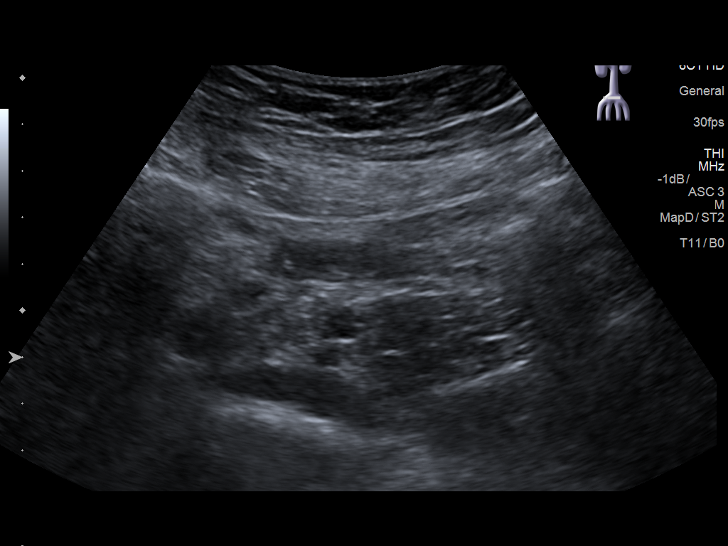

[14 of 25 positions shown; findings below may reference images not displayed]

FINDINGS: Gallbladder: Small amount of sludge within the gallbladder. Possible
small 2 mm stone at the gallbladder neck. Negative sonographic
Murphy. Normal wall thickness.

Common bile duct: Diameter: 2.8 mm

Liver: No focal lesion identified. Within normal limits in
parenchymal echogenicity. Portal vein is patent on color Doppler
imaging with normal direction of blood flow towards the liver.

IVC: No abnormality visualized.

Pancreas: Visualized portion unremarkable.

Spleen: Size and appearance within normal limits.

Right Kidney: Length: 11.5 cm. Echogenicity within normal limits. No
mass or hydronephrosis visualized.

Left Kidney: Length: 11.9 cm. Echogenicity within normal limits. No
mass or hydronephrosis visualized.

Abdominal aorta: No aneurysm visualized.

Other findings: None.
IMPRESSION: 1. Small amount of sludge in probable punctate stone in the
gallbladder. Negative for acute cholecystitis or biliary dilatation
2. Otherwise negative abdominal ultrasound

## 2021-01-13 ENCOUNTER — Encounter (HOSPITAL_BASED_OUTPATIENT_CLINIC_OR_DEPARTMENT_OTHER): Payer: Self-pay | Admitting: Emergency Medicine

## 2021-01-13 ENCOUNTER — Emergency Department (HOSPITAL_BASED_OUTPATIENT_CLINIC_OR_DEPARTMENT_OTHER)
Admission: EM | Admit: 2021-01-13 | Discharge: 2021-01-13 | Disposition: A | Discharge status: Home / Self Care | Payer: 59 | Attending: Emergency Medicine | Admitting: Emergency Medicine

## 2021-01-13 ENCOUNTER — Other Ambulatory Visit: Payer: Self-pay

## 2021-01-13 DIAGNOSIS — J45909 Unspecified asthma, uncomplicated: Secondary | ICD-10-CM | POA: Diagnosis not present

## 2021-01-13 DIAGNOSIS — N898 Other specified noninflammatory disorders of vagina: Secondary | ICD-10-CM | POA: Insufficient documentation

## 2021-01-13 DIAGNOSIS — Z202 Contact with and (suspected) exposure to infections with a predominantly sexual mode of transmission: Secondary | ICD-10-CM | POA: Diagnosis not present

## 2021-01-13 DIAGNOSIS — R102 Pelvic and perineal pain: Secondary | ICD-10-CM | POA: Diagnosis present

## 2021-01-13 DIAGNOSIS — Z711 Person with feared health complaint in whom no diagnosis is made: Secondary | ICD-10-CM

## 2021-01-13 LAB — URINALYSIS, ROUTINE W REFLEX MICROSCOPIC
Bilirubin Urine: NEGATIVE
Glucose, UA: NEGATIVE mg/dL
Ketones, ur: NEGATIVE mg/dL
Leukocytes,Ua: NEGATIVE
Nitrite: NEGATIVE
Protein, ur: NEGATIVE mg/dL
Specific Gravity, Urine: 1.02 (ref 1.005–1.030)
pH: 7 (ref 5.0–8.0)

## 2021-01-13 LAB — WET PREP, GENITAL
Clue Cells Wet Prep HPF POC: NONE SEEN
Sperm: NONE SEEN
Trich, Wet Prep: NONE SEEN
Yeast Wet Prep HPF POC: NONE SEEN

## 2021-01-13 LAB — URINALYSIS, MICROSCOPIC (REFLEX)

## 2021-01-13 LAB — PREGNANCY, URINE: Preg Test, Ur: NEGATIVE

## 2021-01-13 MED ORDER — CEFTRIAXONE SODIUM 500 MG IJ SOLR
500.0000 mg | Freq: Once | INTRAMUSCULAR | Status: AC
  Administered 2021-01-13: 500 mg via INTRAMUSCULAR
  Filled 2021-01-13: qty 500

## 2021-01-13 MED ORDER — DOXYCYCLINE HYCLATE 100 MG PO CAPS: 100.0000 mg | ORAL_CAPSULE | Freq: Two times a day (BID) | ORAL | 0 refills | Status: DC

## 2021-01-13 NOTE — ED Provider Notes (Signed)
[facility] Provider Note   CSN: [id number] Arrival date & time: 01/13/21  1331     History Chief Complaint  Patient presents with   Back Pain    Valerie Small is a 40 y.o. female.  Patient with no pertinent past medical history presents today with chief complaint of abdominal and back pain.  She states that her symptoms began following use of an anal sex toy that she feels "went too far."  She endorses also experiencing painful sex, vaginal discharge, vaginal itching.  She does state that she has had a new sexual partner in the last few months.  She did a tele visit for these symptoms and was diagnosed with BV and completed 10 day course of Flagyl without improvement in her symptoms. She is currently on her menstrual cycle which is on time for her.  She does state that it has been a little bit lighter than normal.  She has a history of kidney stones, however states that the symptoms are significantly more benign than her typical kidney stone symptoms.  She denies burning with urination or polyuria.  She states "it feels like I have a tampon in."  She denies any pain with defecation, hematochezia, or melena.  No fevers or chills.  She is G2P1 with a history of a tubal ligation in 2018.  The history is provided by the patient. No language interpreter was used.  Back Pain Associated symptoms: pelvic pain   Associated symptoms: no abdominal pain, no dysuria and no fever       Past Medical History:  Diagnosis Date   Anxiety    Eczema    History of kidney stones    2009  passed spontaneously    HSV-2 infection    genital    Patient Active Problem List   Diagnosis Date Noted   S/P tubal ligation 01/02/2017   Wellness examination 06/29/2014   Trapezius strain 06/29/2014   History of asthma 06/29/2014   Kidney stone 06/29/2014    Past Surgical History:  Procedure Laterality Date   LAPAROSCOPIC TUBAL LIGATION Bilateral 01/02/2017   Procedure:  LAPAROSCOPIC TUBAL LIGATION;  Surgeon: Sherlyn Hay, DO;  Location: [place] [facility];  Service: Gynecology;  Laterality: Bilateral;  Request RNFA   WISDOM TOOTH EXTRACTION  2001     OB History   No obstetric history on file.     Family History  Problem Relation Age of Onset   Hypertension Mother    Diverticulosis Mother        had to hae bowel resections    Hypertension Father    Diabetes Maternal Aunt    Diverticulosis Maternal Aunt    Diverticulosis Maternal Aunt    Colon cancer Neg Hx     Social History   Tobacco Use   Smoking status: Never   Smokeless tobacco: Never  Vaping Use   Vaping Use: Never used  Substance Use Topics   Alcohol use: Yes    Alcohol/week: 0.0 standard drinks    Comment: 2 drinks per day   Drug use: No    Home Medications Prior to Admission medications   Medication Sig Start Date End Date Taking? Authorizing Provider  omeprazole (PRILOSEC) 20 MG capsule Take 1 capsule (20 mg total) by mouth daily. 05/16/20   Cirigliano, Vito V, DO  ondansetron (ZOFRAN ODT) 8 MG disintegrating tablet Take 1 tablet (8 mg total) by mouth every 6 (six) hours as needed for nausea or vomiting. 05/16/20  Cirigliano, Vito V, DO  ondansetron (ZOFRAN) 4 MG tablet Take 1 tablet (4 mg total) by mouth every 6 (six) hours. Patient not taking: No sig reported 04/09/18   Cirigliano, Vito V, DO  valACYclovir (VALTREX) 500 MG tablet Take 500 mg by mouth as needed.    [provider]    Allergies    Patient has no known allergies.  Review of Systems   Review of Systems  Constitutional:  Negative for appetite change, chills and fever.  Respiratory:  Negative for shortness of breath.   Gastrointestinal:  Negative for abdominal distention, abdominal pain, anal bleeding, blood in stool, constipation, diarrhea, nausea, rectal pain and vomiting.  Genitourinary:  Positive for dyspareunia, flank pain, pelvic pain, vaginal bleeding, vaginal discharge and  vaginal pain. Negative for difficulty urinating, dysuria, enuresis, genital sores, hematuria and menstrual problem.  Musculoskeletal:  Positive for back pain.  Skin:  Negative for rash and wound.  All other systems reviewed and are negative.  Physical Exam Updated Vital Signs BP 125/80   Pulse 78   Temp 98.4 F (36.9 C) (Oral)   Resp 18   Ht 5\' 6"  (1.676 m)   Wt 88.5 kg   LMP 01/09/2021   SpO2 100%   BMI 31.47 kg/m   Physical Exam Vitals and nursing note reviewed. Exam conducted with a chaperone present.  Constitutional:      General: She is not in acute distress.    Appearance: Normal appearance. She is normal weight. She is not ill-appearing, toxic-appearing or diaphoretic.  HENT:     Head: Normocephalic and atraumatic.  Cardiovascular:     Rate and Rhythm: Normal rate and regular rhythm.     Heart sounds: Normal heart sounds.  Pulmonary:     Effort: Pulmonary effort is normal.     Breath sounds: Normal breath sounds.  Abdominal:     General: Abdomen is flat. Bowel sounds are normal. There is no distension.     Palpations: Abdomen is soft. There is no mass.     Tenderness: There is no abdominal tenderness. There is no right CVA tenderness, left CVA tenderness, guarding or rebound.     Hernia: No hernia is present.  Genitourinary:    General: Normal vulva.     Exam position: Lithotomy position.     Pubic Area: No rash or pubic lice.      Vagina: Bleeding present. No vaginal discharge.     Cervix: Cervical bleeding present. No cervical motion tenderness, friability, lesion or erythema.     Adnexa:        Right: No tenderness.         Left: No tenderness.       Rectum: Normal.  Musculoskeletal:        General: Normal range of motion.  Skin:    General: Skin is warm and dry.  Neurological:     General: No focal deficit present.     Mental Status: She is alert.  Psychiatric:        Mood and Affect: Mood normal.        Behavior: Behavior normal.    ED Results /  Procedures / Treatments   Labs (all labs ordered are listed, but only abnormal results are displayed) Labs Reviewed  WET PREP, GENITAL - Abnormal; Notable for the following components:      Result Value   WBC, Wet Prep HPF POC FEW (*)    All other components within normal limits  URINALYSIS, ROUTINE W  REFLEX MICROSCOPIC - Abnormal; Notable for the following components:   Hgb urine dipstick SMALL (*)    All other components within normal limits  URINALYSIS, MICROSCOPIC (REFLEX) - Abnormal; Notable for the following components:   Bacteria, UA RARE (*)    All other components within normal limits  PREGNANCY, URINE  GC/CHLAMYDIA PROBE AMP ([facility]) NOT AT [facility]    EKG None  Radiology No results found.  Procedures Procedures   Medications Ordered in ED Medications  cefTRIAXone (ROCEPHIN) injection 500 mg (has no administration in time range)    ED Course  I have reviewed the triage vital signs and the nursing notes.  Pertinent labs & imaging results that were available during my care of the patient were reviewed by me and considered in my medical decision making (see chart for details).    MDM Rules/Calculators/A&P                         Patient presents today with complaint of back and pelvic pain x 2 weeks following use of an anal sex toy. Patient is nontoxic, nonseptic appearing, in no apparent distress.  Patient's pain and other symptoms adequately managed in emergency department. Labs and vitals reviewed. UA negative for UTI, she is not pregnant. Pelvic exam unremarkable, no CMT or adnexal tenderness, doubt ovarian torsion. Rectal exam also unremarkable, no visible injury or pain out of proportion. Patient does not meet the SIRS or Sepsis criteria.  On repeat exam patient does not have a surgical abdomin and there are no peritoneal signs.  No indication of nephrolithiasis, appendicitis, bowel obstruction, bowel perforation, cholecystitis, diverticulitis, or ectopic  pregnancy. Given that patient endorses vaginal discharge with new sexual partner and exam skewed by her being on her menstrual cycle, will treat prophylactically for GC/Chlamydia. Patient is in agreement with this plan. Pt understands that they have GC/Chlamydia cultures pending and that they will need to inform all sexual partners if results return positive. Pt has been treated prophylactically with doxycycline and Rocephin due to pts history, pelvic exam, and wet prep with increased WBCs.  Pt not concerning for PID because hemodynamically stable and no cervical motion tenderness on pelvic exam. Patient to be discharged with instructions to follow up with OBGYN/PCP. Discussed importance of using protection when sexually active.  Patient discharged home with symptomatic treatment and given strict instructions for follow-up with their primary care physician.  I have also discussed reasons to return immediately to the ER.  Patient expresses understanding and agrees with plan.  She is discharged in stable condition.    This is a shared visit with supervising physician Dr. Dina Rich who has independently evaluated patient & provided guidance in evaluation/management/disposition, in agreement with care    Final Clinical Impression(s) / ED Diagnoses Final diagnoses:  Concern about STD in female without diagnosis    Rx / DC Orders ED Discharge Orders          Ordered    doxycycline (VIBRAMYCIN) 100 MG capsule  2 times daily        01/13/21 2307          An After Visit Summary was printed and given to the patient.    Bud Face, PA-C 01/13/21 2319    Lorelle Gibbs, DO 01/14/21 2324

## 2021-01-13 NOTE — Discharge Instructions (Signed)
You were thoroughly evaluated in the emergency department this evening.  Your work-up was reassuringly negative for emergent findings.  I have prescribed antibiotic prophylaxis for STD as this panel takes 24 hours to result.  Please monitor the patient portal for results.  Additionally, it is very important that you follow-up with your OB/GYN for continued symptom evaluation.  Return if development of new or worsening symptoms.

## 2021-01-13 NOTE — ED Triage Notes (Signed)
Pt c/o low back pain, abdominal, vaginal discharge, painful intercourse x 2 weeks. At home UTI test performed and was positive.

## 2021-01-15 LAB — GC/CHLAMYDIA PROBE AMP (~~LOC~~) NOT AT ARMC
Chlamydia: NEGATIVE
Comment: NEGATIVE
Comment: NORMAL
Neisseria Gonorrhea: NEGATIVE

## 2022-08-02 ENCOUNTER — Ambulatory Visit (INDEPENDENT_AMBULATORY_CARE_PROVIDER_SITE_OTHER): Payer: Self-pay | Admitting: Medical

## 2022-08-02 VITALS — BP 120/66 | HR 73 | Temp 98.0°F | Resp 18 | Ht 66.0 in | Wt 160.8 lb

## 2022-08-02 DIAGNOSIS — F17200 Nicotine dependence, unspecified, uncomplicated: Secondary | ICD-10-CM

## 2022-08-02 DIAGNOSIS — Z01818 Encounter for other preprocedural examination: Secondary | ICD-10-CM

## 2022-08-02 MED ORDER — BUPROPION HCL ER (XL) 150 MG PO TB24: 150.0000 mg | ORAL_TABLET | Freq: Every day | ORAL | 1 refills | Status: DC

## 2022-08-02 NOTE — Patient Instructions (Addendum)
Elective upcoming breast augmentation surgery. No chronic medical problems/no contraindications. We got EKG today.. Future cbc, cmp, ptt and pt. Please get scheduled for those labs to be done fasting per surgeon request.  EKG- sinus rhythm. Looks like poor r wave progression. No prior EKG to compare. No cardiac associate sign/symptoms history. So not referring to cardiologist. Will provide copy to pt to forward to surgeon.  For smoking cessation wellbutrin xl. Rx advisement given. Best to quit smoking in light of surgery upcoming.  Fill out preop form pending lab review.  Follow up date to determined

## 2022-08-02 NOTE — Progress Notes (Signed)
Subjective:    Patient ID: Valerie Small, female    DOB: 15-Mar-1980, 42 y.o.   MRN: [id number]  HPI  Pt has not been in for about 3 years.  Pt has pre-op form for breast augmentation surgery in [place] end of July. Breast augmentation. Pt does smoke. 3 cigarettes a day. Pt told stop smoking 30 days prior to procedure. Pt states she has read quit 8 weeks prior to surgery. Pt wants to quit.   No chronic medical problems.  Pt has lost weight 35 lb since last visit. She got semaglutide.  Lmp- May 1st.    No sob, no chest pain, no leg pain. No easy bruising.    Review of Systems  Constitutional:  Negative for chills, fatigue and fever.  HENT:  Negative for congestion, drooling and ear discharge.   Respiratory:  Negative for cough, chest tightness, shortness of breath and wheezing.   Cardiovascular:  Negative for chest pain and palpitations.  Gastrointestinal:  Negative for abdominal pain, anal bleeding, constipation, nausea and vomiting.  Musculoskeletal:  Negative for back pain and gait problem.  Skin:  Negative for rash.  Neurological:  Negative for dizziness, light-headedness and headaches.  Hematological:  Negative for adenopathy. Does not bruise/bleed easily.  Psychiatric/Behavioral:  Negative for behavioral problems, confusion and sleep disturbance. The patient is not nervous/anxious.     Past Medical History:  Diagnosis Date   Anxiety    Eczema    History of kidney stones    2009  passed spontaneously    HSV-2 infection    genital     Social History   Socioeconomic History   Marital status: Married    Spouse name: Not on file   Number of children: 1   Years of education: associates   Highest education level: Associate degree: occupational, Scientist, product/process development, or vocational program  Occupational History   Occupation: Customer service  Tobacco Use   Smoking status: Never   Smokeless tobacco: Never  Vaping Use   Vaping Use: Never used  Substance and Sexual Activity    Alcohol use: Yes    Alcohol/week: 0.0 standard drinks of alcohol    Comment: 2 drinks per day   Drug use: No   Sexual activity: Not on file  Other Topics Concern   Not on file  Social History Narrative   Married, lives with husband in one story home   1 child, 2 dogs   Social Determinants of Health   Financial Resource Strain: Low Risk  (08/02/2022)   Overall Financial Resource Strain (CARDIA)    Difficulty of Paying Living Expenses: Not hard at all  Food Insecurity: No Food Insecurity (08/02/2022)   Hunger Vital Sign    Worried About Running Out of Food in the Last Year: Never true    Ran Out of Food in the Last Year: Never true  Transportation Needs: No Transportation Needs (08/02/2022)   PRAPARE - Administrator, Civil Service (Medical): No    Lack of Transportation (Non-Medical): No  Physical Activity: Sufficiently Active (08/02/2022)   Exercise Vital Sign    Days of Exercise per Week: 3 days    Minutes of Exercise per Session: 60 min  Stress: Stress Concern Present (08/02/2022)   [employer] of Occupational Health - Occupational Stress Questionnaire    Feeling of Stress : Rather much  Social Connections: Moderately Isolated (08/02/2022)   Social Connection and Isolation Panel [NHANES]    Frequency of Communication with Friends and Family: More  than three times a week    Frequency of Social Gatherings with Friends and Family: Once a week    Attends Religious Services: Never    Database administrator or Organizations: Yes    Attends Engineer, structural: More than 4 times per year    Marital Status: Divorced  Catering manager Violence: Not on file    Past Surgical History:  Procedure Laterality Date   LAPAROSCOPIC TUBAL LIGATION Bilateral 01/02/2017   Procedure: LAPAROSCOPIC TUBAL LIGATION;  Surgeon: Edwinna Areola, DO;  Location: [facility];  Service: Gynecology;  Laterality: Bilateral;  Request RNFA   WISDOM TOOTH  EXTRACTION  2001    Family History  Problem Relation Age of Onset   Hypertension Mother    Diverticulosis Mother        had to hae bowel resections    Hypertension Father    Diabetes Maternal Aunt    Diverticulosis Maternal Aunt    Diverticulosis Maternal Aunt    Colon cancer Neg Hx     No Known Allergies  Current Outpatient Medications on File Prior to Visit  Medication Sig Dispense Refill   valACYclovir (VALTREX) 500 MG tablet Take 500 mg by mouth as needed.     No current facility-administered medications on file prior to visit.    BP 120/66   Pulse 73   Temp 98 F (36.7 C)   Resp 18   Ht 5\' 6"  (1.676 m)   Wt 160 lb 12.8 oz (72.9 kg)   LMP 07/10/2022   SpO2 100%   BMI 25.95 kg/m        Objective:   Physical Exam  General Mental Status- Alert. General Appearance- Not in acute distress.   Skin General: Color- Normal Color. Moisture- Normal Moisture.  Neck Carotid Arteries- Normal color. Moisture- Normal Moisture. No carotid bruits. No JVD.  Chest and Lung Exam Auscultation: Breath Sounds:-Normal.  Cardiovascular Auscultation:Rythm- Regular. Murmurs & Other Heart Sounds:Auscultation of the heart reveals- No Murmurs.  Abdomen Inspection:-Inspeection Normal. Palpation/Percussion:Note:No mass. Palpation and Percussion of the abdomen reveal- Non Tender, Non Distended + BS, no rebound or guarding.   Neurologic Cranial Nerve exam:- CN III-XII intact(No nystagmus), symmetric smile. Drift Test:- No drift. Romberg Exam:- Negative.  Heal to Toe Gait exam:-Normal. Finger to Nose:- Normal/Intact Strength:- 5/5 equal and symmetric strength both upper and lower extremities.       Assessment & Plan:   Patient Instructions  Elective upcoming breast augmentation surgery. No chronic medical problems/no contraindications. We got EKG today.. Future cbc, cmp, ptt and pt. Please get scheduled for those labs to be done fasting per surgeon request.  EKG- sinus  rhythm. Looks like poor r wave progression. No prior EKG to compare. No cardiac associate sign/symptoms history. So not referring to cardiologist. Will provide copy to pt to forward to surgeon.  For smoking cessation wellbutrin xl. Rx advisement given. Best to quit smoking in light of surgery upcoming.  Fill out preop form pending lab review.  Follow up date to determined       Esperanza Richters PA-C

## 2022-08-06 ENCOUNTER — Other Ambulatory Visit (INDEPENDENT_AMBULATORY_CARE_PROVIDER_SITE_OTHER): Payer: Self-pay

## 2022-08-06 DIAGNOSIS — Z01818 Encounter for other preprocedural examination: Secondary | ICD-10-CM

## 2022-08-06 LAB — CBC WITH DIFFERENTIAL/PLATELET
Absolute Monocytes: 443 cells/uL (ref 200–950)
Basophils Absolute: 59 cells/uL (ref 0–200)
Basophils Relative: 1 %
Eosinophils Absolute: 159 cells/uL (ref 15–500)
Eosinophils Relative: 2.7 %
HCT: 43 % (ref 35.0–45.0)
Hemoglobin: 14.5 g/dL (ref 11.7–15.5)
Lymphs Abs: 1375 cells/uL (ref 850–3900)
MCH: 29.4 pg (ref 27.0–33.0)
MCHC: 33.7 g/dL (ref 32.0–36.0)
MCV: 87.2 fL (ref 80.0–100.0)
MPV: 10.8 fL (ref 7.5–12.5)
Monocytes Relative: 7.5 %
Neutro Abs: 3865 cells/uL (ref 1500–7800)
Neutrophils Relative %: 65.5 %
Platelets: 307 10*3/uL (ref 140–400)
RBC: 4.93 10*6/uL (ref 3.80–5.10)
RDW: 13 % (ref 11.0–15.0)
Total Lymphocyte: 23.3 %
WBC: 5.9 10*3/uL (ref 3.8–10.8)

## 2022-08-06 LAB — COMPREHENSIVE METABOLIC PANEL
AG Ratio: 1.8 (calc) (ref 1.0–2.5)
ALT: 16 U/L (ref 6–29)
AST: 16 U/L (ref 10–30)
Albumin: 4.4 g/dL (ref 3.6–5.1)
Alkaline phosphatase (APISO): 55 U/L (ref 31–125)
BUN: 12 mg/dL (ref 7–25)
CO2: 29 mmol/L (ref 20–32)
Calcium: 9.3 mg/dL (ref 8.6–10.2)
Chloride: 104 mmol/L (ref 98–110)
Creat: 0.88 mg/dL (ref 0.50–0.99)
Globulin: 2.4 g/dL (calc) (ref 1.9–3.7)
Glucose, Bld: 79 mg/dL (ref 65–99)
Potassium: 4 mmol/L (ref 3.5–5.3)
Sodium: 138 mmol/L (ref 135–146)
Total Bilirubin: 0.6 mg/dL (ref 0.2–1.2)
Total Protein: 6.8 g/dL (ref 6.1–8.1)

## 2022-08-07 ENCOUNTER — Encounter: Payer: Self-pay | Admitting: Medical

## 2022-08-07 LAB — PT AND PTT
INR: 1 (ref 0.9–1.2)
Prothrombin Time: 11.2 s (ref 9.1–12.0)
aPTT: 29 s (ref 24–33)

## 2022-08-08 NOTE — Telephone Encounter (Signed)
Is form completed?

## 2022-09-26 ENCOUNTER — Other Ambulatory Visit: Payer: Self-pay | Admitting: Medical

## 2023-01-12 ENCOUNTER — Emergency Department (HOSPITAL_COMMUNITY): Payer: Self-pay

## 2023-01-12 ENCOUNTER — Emergency Department (HOSPITAL_COMMUNITY)
Admission: EM | Admit: 2023-01-12 | Discharge: 2023-01-12 | Disposition: A | Discharge status: Home / Self Care | Payer: Self-pay | Attending: Emergency Medicine | Admitting: Emergency Medicine

## 2023-01-12 DIAGNOSIS — F17200 Nicotine dependence, unspecified, uncomplicated: Secondary | ICD-10-CM | POA: Insufficient documentation

## 2023-01-12 DIAGNOSIS — L03313 Cellulitis of chest wall: Secondary | ICD-10-CM | POA: Insufficient documentation

## 2023-01-12 DIAGNOSIS — N61 Mastitis without abscess: Secondary | ICD-10-CM

## 2023-01-12 DIAGNOSIS — R079 Chest pain, unspecified: Secondary | ICD-10-CM | POA: Insufficient documentation

## 2023-01-12 LAB — CBC WITH DIFFERENTIAL/PLATELET
Abs Immature Granulocytes: 0.03 10*3/uL (ref 0.00–0.07)
Basophils Absolute: 0 10*3/uL (ref 0.0–0.1)
Basophils Relative: 0 %
Eosinophils Absolute: 0 10*3/uL (ref 0.0–0.5)
Eosinophils Relative: 0 %
HCT: 44.7 % (ref 36.0–46.0)
Hemoglobin: 15.6 g/dL — ABNORMAL HIGH (ref 12.0–15.0)
Immature Granulocytes: 0 %
Lymphocytes Relative: 10 %
Lymphs Abs: 1 10*3/uL (ref 0.7–4.0)
MCH: 29.9 pg (ref 26.0–34.0)
MCHC: 34.9 g/dL (ref 30.0–36.0)
MCV: 85.6 fL (ref 80.0–100.0)
Monocytes Absolute: 0.8 10*3/uL (ref 0.1–1.0)
Monocytes Relative: 8 %
Neutro Abs: 7.9 10*3/uL — ABNORMAL HIGH (ref 1.7–7.7)
Neutrophils Relative %: 82 %
Platelets: 340 10*3/uL (ref 150–400)
RBC: 5.22 MIL/uL — ABNORMAL HIGH (ref 3.87–5.11)
RDW: 12.7 % (ref 11.5–15.5)
WBC: 9.7 10*3/uL (ref 4.0–10.5)
nRBC: 0 % (ref 0.0–0.2)

## 2023-01-12 LAB — COMPREHENSIVE METABOLIC PANEL
ALT: 23 U/L (ref 0–44)
AST: 21 U/L (ref 15–41)
Albumin: 3.9 g/dL (ref 3.5–5.0)
Alkaline Phosphatase: 60 U/L (ref 38–126)
Anion gap: 12 (ref 5–15)
BUN: 13 mg/dL (ref 6–20)
CO2: 25 mmol/L (ref 22–32)
Calcium: 9.3 mg/dL (ref 8.9–10.3)
Chloride: 103 mmol/L (ref 98–111)
Creatinine, Ser: 0.66 mg/dL (ref 0.44–1.00)
GFR, Estimated: >60 mL/min (ref >60)
Glucose, Bld: 91 mg/dL (ref 70–99)
Potassium: 3.8 mmol/L (ref 3.5–5.1)
Sodium: 140 mmol/L (ref 135–145)
Total Bilirubin: 0.8 mg/dL (ref 0.3–1.2)
Total Protein: 7.5 g/dL (ref 6.5–8.1)

## 2023-01-12 LAB — HCG, SERUM, QUALITATIVE: Preg, Serum: NEGATIVE

## 2023-01-12 LAB — LACTIC ACID, PLASMA: Lactic Acid, Venous: 0.8 mmol/L (ref 0.5–1.9)

## 2023-01-12 MED ORDER — ACETAMINOPHEN 500 MG PO TABS
1000.0000 mg | ORAL_TABLET | Freq: Once | ORAL | Status: AC
  Administered 2023-01-12: 1000 mg via ORAL
  Filled 2023-01-12: qty 2

## 2023-01-12 MED ORDER — DOXYCYCLINE HYCLATE 100 MG PO CAPS: 100.0000 mg | ORAL_CAPSULE | Freq: Two times a day (BID) | ORAL | 0 refills | Status: AC

## 2023-01-12 MED ORDER — VANCOMYCIN HCL 1750 MG/350ML IV SOLN
1750.0000 mg | Freq: Once | INTRAVENOUS | Status: AC
  Administered 2023-01-12: 1750 mg via INTRAVENOUS
  Filled 2023-01-12: qty 350

## 2023-01-12 MED ORDER — IOHEXOL 300 MG/ML  SOLN
75.0000 mL | Freq: Once | INTRAMUSCULAR | Status: AC | PRN
  Administered 2023-01-12: 75 mL via INTRAVENOUS

## 2023-01-12 NOTE — ED Provider Notes (Signed)
[place] EMERGENCY DEPARTMENT AT [facility] Provider Note   CSN: [id number] Arrival date & time: 01/12/23  7253     History Chief Complaint  Patient presents with   Post-op Problem    Valerie Small is a 42 y.o. female reportedly otherwise healthy presents to the ER for evaluation of right breast erythema and fever. The patient reports that she had a bilateral breast augmentation 3 months ago. She reports that she noticed that she had a fever of 102.16F last night, but improved with Tylenol, last dose today at 0100. She reports that today she noticed that her breast was red and slightly swollen. She reports that there was some pain, but she was more concerned with the redness. She denies any known complications with her surgery. She reports that she has been healing well. She denies any trauma to the breast. Denies any discharge from the wound or the nipple. Denies any bleeding. She follows up with her surgeon by providing him images as they are [place]. Partner at bedside reports that she does pick at her skin. Daily tobacco use.   HPI     Home Medications Prior to Admission medications   Medication Sig Start Date End Date Taking? Authorizing Provider  buPROPion (WELLBUTRIN XL) 150 MG 24 hr tablet Take 1 tablet by mouth once daily Patient not taking: Reported on 01/12/2023 09/26/22   Saguier, Ramon Dredge, PA-C      Allergies    Patient has no known allergies.    Review of Systems   Review of Systems  Constitutional:  Positive for fever.  Skin:        Reports breast pain and erythema  See HPI  Physical Exam Updated Vital Signs BP 123/79   Pulse 98   Temp 98.8 F (37.1 C) (Oral)   Resp 18   Ht 5\' 5"  (1.651 m)   Wt 75.3 kg   SpO2 98%   BMI 27.61 kg/m  Physical Exam Vitals and nursing note reviewed. Exam conducted with a chaperone present (Bethany, NT).  Constitutional:      General: She is not in acute distress.    Appearance: She is not toxic-appearing.   HENT:     Mouth/Throat:     Mouth: Mucous membranes are moist.  Eyes:     General: No scleral icterus. Cardiovascular:     Rate and Rhythm: Normal rate.  Pulmonary:     Effort: Pulmonary effort is normal. No respiratory distress.  Chest:  Breasts:    Right: Skin change and tenderness present. No inverted nipple or nipple discharge.       Comments: Erythema to the marked area above. Slight erythema to the areola as well. Nipples appear symmetric. No discharge.Surgical incisions seen to be intake. Mild scab noted on the rim of the areola in the 6-9'oclock position. No wound dehiscence. No palpable induration or fluctuance. Mild increase in warmth. Mild swelling in comparison to the left. No crepitus.  Abdominal:     Tenderness: There is no abdominal tenderness. There is no guarding or rebound.  Skin:    General: Skin is warm and dry.  Neurological:     Mental Status: She is alert.     ED Results / Procedures / Treatments   Labs (all labs ordered are listed, but only abnormal results are displayed) Labs Reviewed  CBC WITH DIFFERENTIAL/PLATELET - Abnormal; Notable for the following components:      Result Value   RBC 5.22 (*)  Hemoglobin 15.6 (*)    Neutro Abs 7.9 (*)    All other components within normal limits  HCG, SERUM, QUALITATIVE  COMPREHENSIVE METABOLIC PANEL  LACTIC ACID, PLASMA  LACTIC ACID, PLASMA  I-STAT CG4 LACTIC ACID, ED    EKG None  Radiology CT Chest W Contrast  Result Date: 01/12/2023 CLINICAL DATA:  Soft tissue infection. Status post breast augmentation 3 months ago. Right breast is swollen and red. Fever for 3 days EXAM: CT CHEST WITH CONTRAST TECHNIQUE: Multidetector CT imaging of the chest was performed during intravenous contrast administration. RADIATION DOSE REDUCTION: This exam was performed according to the departmental dose-optimization program which includes automated exposure control, adjustment of the mA and/or kV according to patient size  and/or use of iterative reconstruction technique. CONTRAST:  75mL OMNIPAQUE IOHEXOL 300 MG/ML  SOLN COMPARISON:  None Available. FINDINGS: Cardiovascular: No significant vascular findings. Normal heart size. No pericardial effusion. Mediastinum/Nodes: No enlarged mediastinal, hilar, or axillary lymph nodes. Thyroid gland, trachea, and esophagus demonstrate no significant findings. Lungs/Pleura: There is some linear opacity dependently along bases likely scar or atelectasis. No pleural effusion. Upper Abdomen: Mild fatty liver infiltration. Musculoskeletal: Bilateral breast implants are seen. There is slight right-sided asymmetric ill-defined breast tissue and skin thickening. No soft tissue gas. No findings a rim enhancing fluid collection. IMPRESSION: Mild asymmetric right-sided breast tissue and skin thickening compared to left. No rim enhancing fluid collection or soft tissue gas. Appearance has a differential. Please correlate with clinical presentation. Dedicated mammography could be considered as clinically appropriate for further delineation. Electronically Signed   By: Karen Kays M.D.   On: 01/12/2023 16:04    Procedures Procedures   Medications Ordered in ED Medications  vancomycin (VANCOREADY) IVPB 1750 mg/350 mL (1,750 mg Intravenous New Bag/Given 01/12/23 1716)  iohexol (OMNIPAQUE) 300 MG/ML solution 75 mL (75 mLs Intravenous Contrast Given 01/12/23 1518)    ED Course/ Medical Decision Making/ A&P    Medical Decision Making Amount and/or Complexity of Data Reviewed Labs: ordered. Radiology: ordered.  Risk OTC drugs. Prescription drug management.   42 y.o. female presents to the ER for evaluation of rigth breast swelling/redness and fever. Differential diagnosis includes but is not limited to postop complication, abscess, cellulitis. Vital signs elevated temperature to 99.51F, was tachycardic however now is improved.  Satting well room air without increased work of breathing.   Normotensive.Marland Kitchen Physical exam as noted above.   Unfortunately, I am unable to see her surgical note.   I independently reviewed and interpreted the patient's labs.  CBC shows white blood cell count 9.7 however does have a slight left shift with a neutrophil count of 7.9.  Does also have an increased hemoglobin at 15.6.  Could be some hemoconcentration present from possible dehydration.  CMP shows no electrolyte or LFT abnormality.  Lactic acid within normal limits.  hCG is negative.  CT imaging shows mild asymmetric right-sided breast tissue and skin thickening compared to left. No rim enhancing fluid collection or soft tissue gas. Appearance has a differential. Please correlate with clinical presentation. Dedicated mammography could be considered as clinically appropriate for further delineation.  I do not appreciate any indurations or fluctuance to the area.  There is no active discharge.  Think this is likely more strep in nature however I did talk to pharmacy, Abby RPH.  We decided on vancomycin and will discharge home with doxycycline 7 days twice daily.  My attending assessed at bedside and does not think patient needs to be admitted as  long as they get a dose of IV antibiotics and outpatient antibiotics.  Mildly elevated temperature but no leukocytosis.  Negative lactic.  On reevaluation of the skin, do feel that the erythema has lessened some.  I did draw a line around the main parts of the erythema and advised the patient of that the redness spreads past this that she needs to return to the emergency department immediately. Recommended that she call her surgeon tomorrow to discuss her symptoms.   We discussed the results of the labs/imaging. The plan is take antibiotics, follow up with surgeon/PCP, strict return precautions. We discussed strict return precautions and red flag symptoms. The patient verbalized their understanding and agrees to the plan. The patient is stable and being discharged  home in good condition.  Portions of this report may have been transcribed using voice recognition software. Every effort was made to ensure accuracy; however, inadvertent computerized transcription errors may be present.   I discussed this case with my attending physician who cosigned this note including patient's presenting symptoms, physical exam, and planned diagnostics and interventions. Attending physician stated agreement with plan or made changes to plan which were implemented.   Attending physician assessed patient at bedside.  Final Clinical Impression(s) / ED Diagnoses Final diagnoses:  Cellulitis of right breast    Rx / DC Orders ED Discharge Orders          Ordered    doxycycline (VIBRAMYCIN) 100 MG capsule  2 times daily        01/12/23 1940              Achille Rich, [place] 01/12/23 2210    Bethann Berkshire, MD 01/13/23 1044

## 2023-01-12 NOTE — Progress Notes (Signed)
PHARMACY -  BRIEF ANTIBIOTIC NOTE   Pharmacy has received consult(s) for vancomycin from an ED provider.  The patient's profile has been reviewed for ht/wt/allergies/indication/available labs.    One time order(s) placed for vancomycin 1750 mg  Per discussion with ED PA, plan to discharge patient on oral antibiotics following dose of IV antibiotic.   If plan were to change, further antibiotics/pharmacy consults should be ordered by admitting physician if indicated.                       Thank you,  Pricilla Riffle, PharmD, BCPS Clinical Pharmacist 01/12/2023 5:00 PM

## 2023-01-12 NOTE — Discharge Instructions (Addendum)
You were seen in the ER for evaluation of your breast.  I would think you have the beginning stages of infection of the skin.  We have given you some IV antibiotics here and I am sending you home with a week of antibiotics.  Please take twice daily for the next 7 days and do not miss any doses and take to completion.  For pain and fevers I recommended 1000 mg of Tylenol and/or 600 mg of ibuprofen every 6 hours as needed.  Please make sure you are keeping the area clean with Dial soap and water.  Watch for spreading redness over the drawn lines.  Please make sure you follow-up with your surgeon tomorrow as well as your primary care provider for further evaluation of this.  If you have any worsening pain, worsening redness, or spreading redness, worsening fevers, or any other new or worsening symptoms, or any concern, please return to your nearest emergency department for reevaluation.  Contact a health care provider if: You have a fever. Your symptoms do not improve within 1-2 days of starting treatment or you develop new symptoms. Your bone or joint underneath the infected area becomes painful after the skin has healed. Your infection returns in the same area or another area. Signs of this may include: You notice a swollen bump in the infected area. Your red area gets larger, turns dark in color, or becomes more painful. Drainage increases. Pus or a bad smell develops in your infected area. You have more pain. You feel ill and have muscle aches and weakness. You develop vomiting or diarrhea that will not go away. Get help right away if: You notice red streaks coming from the infected area. You notice the skin turns purple or black and falls off. This symptom may be an emergency. Get help right away. Call 911. Do not wait to see if the symptom will go away. Do not drive yourself to the hospital.

## 2023-01-12 NOTE — ED Triage Notes (Signed)
Patient here from home reporting breast augmentation surgery 3 months ago. Reports fever for the last 3 days, redness and tenderness to right side. Tylenol 3am this morning.

## 2023-01-16 ENCOUNTER — Other Ambulatory Visit: Payer: Self-pay | Admitting: Medical

## 2023-01-16 MED ORDER — BUPROPION HCL ER (XL) 150 MG PO TB24: 150.0000 mg | ORAL_TABLET | Freq: Every day | ORAL | 0 refills | Status: AC

## 2023-03-11 ENCOUNTER — Other Ambulatory Visit: Payer: Self-pay | Admitting: Medical
